# Patient Record
Sex: Female | Born: 1945
Health system: Southern US, Community
[De-identification: ages and names within clinical notes are randomized; demographics above are authoritative.]

## PROBLEM LIST (undated history)

## (undated) DIAGNOSIS — E7800 Pure hypercholesterolemia, unspecified: Secondary | ICD-10-CM

## (undated) DIAGNOSIS — I10 Essential (primary) hypertension: Secondary | ICD-10-CM

## (undated) HISTORY — PX: TOE SURGERY: SHX1073

## (undated) HISTORY — PX: TUBAL LIGATION: SHX77

---

## 1998-02-15 ENCOUNTER — Encounter: Admission: RE | Admit: 1998-02-15 | Discharge: 1998-02-15 | Payer: Self-pay | Admitting: Internal Medicine

## 1998-02-25 ENCOUNTER — Encounter: Admission: RE | Admit: 1998-02-25 | Discharge: 1998-02-25 | Payer: Self-pay | Admitting: Internal Medicine

## 1998-03-21 ENCOUNTER — Encounter: Admission: RE | Admit: 1998-03-21 | Discharge: 1998-03-21 | Payer: Self-pay | Admitting: Hematology and Oncology

## 1998-05-06 ENCOUNTER — Observation Stay (HOSPITAL_COMMUNITY): Admission: EM | Admit: 1998-05-06 | Discharge: 1998-05-06 | Payer: Self-pay | Admitting: Emergency Medicine

## 1998-07-04 ENCOUNTER — Emergency Department (HOSPITAL_COMMUNITY): Admission: EM | Admit: 1998-07-04 | Discharge: 1998-07-04 | Payer: Self-pay | Admitting: Emergency Medicine

## 1998-07-04 ENCOUNTER — Encounter: Payer: Self-pay | Admitting: Emergency Medicine

## 2000-07-12 ENCOUNTER — Other Ambulatory Visit: Admission: RE | Admit: 2000-07-12 | Discharge: 2000-07-12 | Payer: Self-pay | Admitting: *Deleted

## 2000-07-13 ENCOUNTER — Encounter (INDEPENDENT_AMBULATORY_CARE_PROVIDER_SITE_OTHER): Payer: Self-pay | Admitting: Specialist

## 2000-07-13 ENCOUNTER — Other Ambulatory Visit: Admission: RE | Admit: 2000-07-13 | Discharge: 2000-07-13 | Payer: Self-pay | Admitting: *Deleted

## 2001-03-23 ENCOUNTER — Emergency Department (HOSPITAL_COMMUNITY): Admission: EM | Admit: 2001-03-23 | Discharge: 2001-03-23 | Payer: Self-pay

## 2001-05-09 ENCOUNTER — Inpatient Hospital Stay (HOSPITAL_COMMUNITY): Admission: AD | Admit: 2001-05-09 | Discharge: 2001-05-09 | Payer: Self-pay | Admitting: Obstetrics & Gynecology

## 2001-06-10 ENCOUNTER — Emergency Department (HOSPITAL_COMMUNITY): Admission: EM | Admit: 2001-06-10 | Discharge: 2001-06-10 | Payer: Self-pay | Admitting: Emergency Medicine

## 2001-06-21 ENCOUNTER — Encounter: Admission: RE | Admit: 2001-06-21 | Discharge: 2001-06-21 | Payer: Self-pay | Admitting: Obstetrics & Gynecology

## 2001-06-21 ENCOUNTER — Other Ambulatory Visit: Admission: RE | Admit: 2001-06-21 | Discharge: 2001-06-21 | Payer: Self-pay | Admitting: *Deleted

## 2001-07-04 ENCOUNTER — Emergency Department (HOSPITAL_COMMUNITY): Admission: EM | Admit: 2001-07-04 | Discharge: 2001-07-04 | Payer: Self-pay | Admitting: Emergency Medicine

## 2001-07-12 ENCOUNTER — Encounter: Admission: RE | Admit: 2001-07-12 | Discharge: 2001-07-12 | Payer: Self-pay | Admitting: Obstetrics & Gynecology

## 2001-07-14 ENCOUNTER — Ambulatory Visit (HOSPITAL_COMMUNITY): Admission: RE | Admit: 2001-07-14 | Discharge: 2001-07-14 | Payer: Self-pay | Admitting: Internal Medicine

## 2001-07-14 ENCOUNTER — Encounter: Admission: RE | Admit: 2001-07-14 | Discharge: 2001-07-14 | Payer: Self-pay | Admitting: Internal Medicine

## 2001-12-02 ENCOUNTER — Encounter: Admission: RE | Admit: 2001-12-02 | Discharge: 2001-12-02 | Payer: Self-pay | Admitting: Internal Medicine

## 2001-12-02 ENCOUNTER — Ambulatory Visit (HOSPITAL_COMMUNITY): Admission: RE | Admit: 2001-12-02 | Discharge: 2001-12-02 | Payer: Self-pay | Admitting: Internal Medicine

## 2002-06-24 ENCOUNTER — Emergency Department (HOSPITAL_COMMUNITY): Admission: EM | Admit: 2002-06-24 | Discharge: 2002-06-25 | Payer: Self-pay | Admitting: Emergency Medicine

## 2002-10-13 ENCOUNTER — Ambulatory Visit (HOSPITAL_COMMUNITY): Admission: RE | Admit: 2002-10-13 | Discharge: 2002-10-13 | Payer: Self-pay

## 2002-11-13 ENCOUNTER — Ambulatory Visit (HOSPITAL_BASED_OUTPATIENT_CLINIC_OR_DEPARTMENT_OTHER): Admission: RE | Admit: 2002-11-13 | Discharge: 2002-11-13 | Payer: Self-pay

## 2002-11-13 ENCOUNTER — Encounter (INDEPENDENT_AMBULATORY_CARE_PROVIDER_SITE_OTHER): Payer: Self-pay | Admitting: *Deleted

## 2003-08-09 ENCOUNTER — Emergency Department (HOSPITAL_COMMUNITY): Admission: EM | Admit: 2003-08-09 | Discharge: 2003-08-09 | Payer: Self-pay | Admitting: Emergency Medicine

## 2003-10-31 ENCOUNTER — Encounter: Admission: RE | Admit: 2003-10-31 | Discharge: 2003-10-31 | Payer: Self-pay | Admitting: Internal Medicine

## 2004-05-28 ENCOUNTER — Ambulatory Visit: Payer: Self-pay | Admitting: Nurse Practitioner

## 2004-06-12 ENCOUNTER — Ambulatory Visit: Payer: Self-pay | Admitting: *Deleted

## 2004-07-22 ENCOUNTER — Ambulatory Visit: Payer: Self-pay | Admitting: Nurse Practitioner

## 2004-08-29 ENCOUNTER — Ambulatory Visit: Payer: Self-pay | Admitting: Nurse Practitioner

## 2004-09-09 ENCOUNTER — Ambulatory Visit: Payer: Self-pay | Admitting: Nurse Practitioner

## 2004-09-11 ENCOUNTER — Ambulatory Visit: Payer: Self-pay | Admitting: Nurse Practitioner

## 2004-12-16 ENCOUNTER — Ambulatory Visit: Payer: Self-pay | Admitting: Nurse Practitioner

## 2005-07-03 ENCOUNTER — Ambulatory Visit: Payer: Self-pay | Admitting: Family Medicine

## 2005-11-20 ENCOUNTER — Ambulatory Visit: Payer: Self-pay | Admitting: Nurse Practitioner

## 2005-11-26 ENCOUNTER — Encounter: Admission: RE | Admit: 2005-11-26 | Discharge: 2005-11-26 | Payer: Self-pay | Admitting: Internal Medicine

## 2005-12-04 ENCOUNTER — Ambulatory Visit: Payer: Self-pay | Admitting: Nurse Practitioner

## 2005-12-29 ENCOUNTER — Emergency Department (HOSPITAL_COMMUNITY): Admission: EM | Admit: 2005-12-29 | Discharge: 2005-12-29 | Payer: Self-pay | Admitting: Emergency Medicine

## 2006-02-05 ENCOUNTER — Ambulatory Visit: Payer: Self-pay | Admitting: Gynecology

## 2006-04-02 ENCOUNTER — Other Ambulatory Visit: Admission: RE | Admit: 2006-04-02 | Discharge: 2006-04-02 | Payer: Self-pay | Admitting: Obstetrics & Gynecology

## 2006-04-02 ENCOUNTER — Ambulatory Visit: Payer: Self-pay | Admitting: Obstetrics & Gynecology

## 2006-04-02 ENCOUNTER — Encounter (INDEPENDENT_AMBULATORY_CARE_PROVIDER_SITE_OTHER): Payer: Self-pay | Admitting: Specialist

## 2006-04-16 ENCOUNTER — Ambulatory Visit: Payer: Self-pay | Admitting: Gynecology

## 2006-05-27 ENCOUNTER — Ambulatory Visit: Payer: Self-pay | Admitting: Nurse Practitioner

## 2006-06-15 ENCOUNTER — Ambulatory Visit: Payer: Self-pay | Admitting: Nurse Practitioner

## 2006-07-21 ENCOUNTER — Ambulatory Visit: Payer: Self-pay | Admitting: Nurse Practitioner

## 2006-07-27 ENCOUNTER — Ambulatory Visit (HOSPITAL_COMMUNITY): Admission: RE | Admit: 2006-07-27 | Discharge: 2006-07-27 | Payer: Self-pay | Admitting: Family Medicine

## 2008-07-04 ENCOUNTER — Encounter: Admission: RE | Admit: 2008-07-04 | Discharge: 2008-07-04 | Payer: Self-pay | Admitting: Occupational Medicine

## 2010-12-30 ENCOUNTER — Other Ambulatory Visit: Payer: Self-pay | Admitting: Family Medicine

## 2010-12-30 ENCOUNTER — Other Ambulatory Visit (HOSPITAL_COMMUNITY): Payer: Self-pay | Admitting: Family Medicine

## 2010-12-30 DIAGNOSIS — Z1231 Encounter for screening mammogram for malignant neoplasm of breast: Secondary | ICD-10-CM

## 2011-01-07 ENCOUNTER — Ambulatory Visit (HOSPITAL_COMMUNITY)
Admission: RE | Admit: 2011-01-07 | Discharge: 2011-01-07 | Disposition: A | Discharge status: Home / Self Care | Payer: Self-pay | Source: Ambulatory Visit | Attending: Family Medicine | Admitting: Family Medicine

## 2011-01-07 DIAGNOSIS — Z1231 Encounter for screening mammogram for malignant neoplasm of breast: Secondary | ICD-10-CM | POA: Insufficient documentation

## 2011-01-23 NOTE — Op Note (Signed)
[name], Barry                      ACCOUNT NO.:  [id number]   MEDICAL RECORD NO.:  [id number]                   PATIENT TYPE:  AMB   LOCATION:  DSC                                  FACILITY:  [facility]   PHYSICIAN:  Alvan Dame, D.P.M.              DATE OF BIRTH:  1946-04-04   DATE OF PROCEDURE:  11/13/2002  DATE OF DISCHARGE:  11/13/2002                                 OPERATIVE REPORT   PREOPERATIVE DIAGNOSIS:  Suspect foreign body abscess, fourth interspace of  the right foot as well as suspect osteomyelitis fifth metatarsal, midshaft  of the right foot.   POSTOPERATIVE DIAGNOSIS:  Suspect foreign body abscess, fourth interspace of  the right foot as well as suspect osteomyelitis fifth metatarsal, midshaft  of the right foot.  A toothpick greater than 1-1/2 length was identified in  the fourth interspace adjacent to the medial surface of the fourth  metatarsal and extending into the fourth webspace interspace area.  Associated soft tissue abscess and discharge and drainage noted.   OPERATION PERFORMED:  Excision of foreign body abscess, soft tissue mass as  well as partial ray resection/amputation fifth metatarsal and fifth digit of  the right foot.   SURGEON:  Alvan Dame, D.P.M.   ANESTHESIA:  Managed anesthesia care, local anesthetic administered total of  approximately 15 cc 50/50 mixture of 2% Xylocaine plain with 0.5% Marcaine  plain with some additional being provided intraoperatively.   TOURNIQUET:  Hemostasis, right ankle tourniquet at 250 mmHg.   INDICATIONS FOR PROCEDURE:  The patient has had a one year history of soft  tissue mass, swelling, pain, difficulty, previous series of x-rays revealed  no signs of foreign body where a toothpick would not have shown up on plain  x-ray.  The patient has undergone MRI which indicated soft tissue abscess  and osteomyelitic changes of the fifth metatarsal with plain radiograph  showing periosteal reaction  along the medial surface of the fifth metatarsal  on AP and oblique views.  Based on her clinical history, a large soft tissue  mass, probable painful tender mass in the fourth interspace.  Patient has  elected and my recommendation is for surgical intervention at this time.   DESCRIPTION OF PROCEDURE:  The patient was brought to the operating room,  placed on the table in the supine position where IV sedation was  established.  The foot was prepped and draped in the usual aseptic manner.  Local anesthetic was administered at this time.  The foot was exsanguinated  with the Esmarch wrap and ankle tourniquet inflated to 250 mmHg.  The  following procedure was then carried out.  Exploration of fourth  intermetatarsal space, excision of soft tissue mass/abscess and based on  interpretive findings, likely possible amputation of the fifth metatarsal  midshaft ray resection and digital amputation.  At this time attention was  directed to the dorsal aspect of the right foot overlying the  fourth  metatarsal space and fourth webspace.  An approximately 7 to 8 cm linear  incision was made along the dorsal surface in line with the extensor tendons  to the fourth web space.  Incision was deepened through sharp and blunt  dissection with hemostasis being acquired as necessary down to the level of  the capsular structures.  The soft tissue capsular mass was identified  within the fourth webspace.  It was extremely fibrotic and intimately  attached to both the medial capsule of the fourth metatarsal, lateral  capsule of the fourth metatarsal, medial capsule of the fifth metatarsal.  At this time with careful dissection, the soft tissue mass was teased away  from adjacent muscle belly, the digital interossei of the fourth webspace  and at this time during the dissection a foreign body was encountered in the  form of a toothpick.  Approximately two thirds of an entire toothpick was  identified within the  abscess site.  A considerable amount of purulent  discharge drainage was also expressed on debridement of this abscess.  Appropriate aerobic and an aerobic cultures were taken at this time and  immediately afterwards, the patient was administered IV antibiotics by  anesthesia 1g of Ancef.  Additional antibiotics will be administered  postoperatively.  The abscess was excised.  Cultures, aerobic and anaerobic  will be obtained at this time.  At this time the entire remaining abscess  and necrotic tissue from the webspace was removed.  The lateral surface of  the fourth metatarsal appeared to be relatively normal.  All necrotic tissue  was debrided carefully at this time.  The area was lavaged with copious  amounts of sterile antibiotic solution and cleared of all soft tissue  debris.  At this time the medial surface of the fifth metatarsal was noted  to be raised and elevated adjacent to where the toothpick was identified and  the capsule-like mass was intimately involved with the medial surface of the  fifth metatarsal shaft.  At this time it was elected to remove the fifth  metatarsal proximal to its midshaft involvement.  At this time utilizing  _______  instrumentation, first the dissection was deepened to the level of  the base of the fifth metatarsal.  ________ instrumentation was utilized to  make an osteotomy to the proximal one third of the fifth metatarsal.  An  oblique osteotomy was carried out.  All rough edges were smoothed.  At this  time the bone was carefully dissected away.  The incision was formed into a  tennis racquet incision encompassing the fifth digit and the fifth digit  including the enteric capsule, the fifth MTP and distal two thirds of the  fifth metatarsal shaft were excised from the site in toto and submitted for  pathology.  At this time the site was carefully lavaged with copious amounts of sterile antibiotic solution and cleared of all soft tissue debris.   A  flap of lateral plantar tissue was made to reapproximate closure.  This  wound was closed with 4-0 Dexon to reapproximate the subcutaneous tissue and  3-0 nylon for skin reapproximation.  It was closed over a quarter inch  Penrose drain.  At this time a dry sterile dressing was applied and ankle  tourniquet deflated with immediate return of perfusion to all remaining  digits being noted.  The patient was discharged from surgery to recovery in  satisfactory condition with vital signs stable. Discharged with all written  postoperative instructions, prescriptions  for pain and antibiotic medication  and appointment for follow up office visit.  The patient will be ambulatory  in a Darco shoe with minimal activities and will be followed up again within  two days for removal of Penrose drain at the [facility].                                                Alvan Dame, D.P.M.    RS/MEDQ  D:  11/15/2002  T:  11/16/2002  Job:  [id number]

## 2011-03-03 ENCOUNTER — Other Ambulatory Visit: Payer: Self-pay | Admitting: Obstetrics and Gynecology

## 2011-10-16 ENCOUNTER — Other Ambulatory Visit: Payer: Self-pay | Admitting: Family Medicine

## 2011-11-29 ENCOUNTER — Other Ambulatory Visit: Payer: Self-pay | Admitting: Family Medicine

## 2012-02-12 ENCOUNTER — Other Ambulatory Visit (HOSPITAL_COMMUNITY): Payer: Self-pay | Admitting: Internal Medicine

## 2012-02-12 DIAGNOSIS — Z1231 Encounter for screening mammogram for malignant neoplasm of breast: Secondary | ICD-10-CM

## 2012-03-09 ENCOUNTER — Ambulatory Visit (HOSPITAL_COMMUNITY)
Admission: RE | Admit: 2012-03-09 | Discharge: 2012-03-09 | Disposition: A | Discharge status: Home / Self Care | Payer: Self-pay | Source: Ambulatory Visit | Attending: Internal Medicine | Admitting: Internal Medicine

## 2012-03-09 DIAGNOSIS — Z1231 Encounter for screening mammogram for malignant neoplasm of breast: Secondary | ICD-10-CM

## 2012-03-15 ENCOUNTER — Other Ambulatory Visit: Payer: Self-pay | Admitting: Internal Medicine

## 2012-03-15 DIAGNOSIS — R928 Other abnormal and inconclusive findings on diagnostic imaging of breast: Secondary | ICD-10-CM

## 2012-03-22 ENCOUNTER — Ambulatory Visit
Admission: RE | Admit: 2012-03-22 | Discharge: 2012-03-22 | Disposition: A | Discharge status: Home / Self Care | Payer: Self-pay | Source: Ambulatory Visit | Attending: Internal Medicine | Admitting: Internal Medicine

## 2012-03-22 DIAGNOSIS — R928 Other abnormal and inconclusive findings on diagnostic imaging of breast: Secondary | ICD-10-CM

## 2012-07-07 ENCOUNTER — Emergency Department (INDEPENDENT_AMBULATORY_CARE_PROVIDER_SITE_OTHER)
Admission: EM | Admit: 2012-07-07 | Discharge: 2012-07-07 | Disposition: A | Discharge status: Home / Self Care | Payer: No Typology Code available for payment source | Source: Home / Self Care | Attending: Emergency Medicine | Admitting: Emergency Medicine

## 2012-07-07 ENCOUNTER — Encounter (HOSPITAL_COMMUNITY): Payer: Self-pay | Admitting: Emergency Medicine

## 2012-07-07 DIAGNOSIS — I10 Essential (primary) hypertension: Secondary | ICD-10-CM

## 2012-07-07 DIAGNOSIS — E785 Hyperlipidemia, unspecified: Secondary | ICD-10-CM

## 2012-07-07 DIAGNOSIS — E876 Hypokalemia: Secondary | ICD-10-CM

## 2012-07-07 DIAGNOSIS — Z720 Tobacco use: Secondary | ICD-10-CM

## 2012-07-07 DIAGNOSIS — F17200 Nicotine dependence, unspecified, uncomplicated: Secondary | ICD-10-CM

## 2012-07-07 HISTORY — DX: Essential (primary) hypertension: I10

## 2012-07-07 HISTORY — DX: Pure hypercholesterolemia, unspecified: E78.00

## 2012-07-07 LAB — POCT I-STAT, CHEM 8
Chloride: 97 mEq/L (ref 96–112)
HCT: 43 % (ref 36.0–46.0)
Hemoglobin: 14.6 g/dL (ref 12.0–15.0)

## 2012-07-07 MED ORDER — PRAVASTATIN SODIUM 40 MG PO TABS: 40.0000 mg | ORAL_TABLET | Freq: Every day | ORAL | Status: DC

## 2012-07-07 MED ORDER — POTASSIUM CHLORIDE CRYS ER 20 MEQ PO TBCR: 20.0000 meq | EXTENDED_RELEASE_TABLET | Freq: Three times a day (TID) | ORAL | Status: DC

## 2012-07-07 MED ORDER — CHLORTHALIDONE 25 MG PO TABS: 25.0000 mg | ORAL_TABLET | Freq: Every day | ORAL | Status: DC

## 2012-07-07 MED ORDER — METOPROLOL TARTRATE 100 MG PO TABS: 100.0000 mg | ORAL_TABLET | Freq: Two times a day (BID) | ORAL | Status: DC

## 2012-07-07 NOTE — ED Provider Notes (Signed)
Chief Complaint  Patient presents with  . Medication Refill    needs refill on meds took last pills today.    History of Present Illness:  Alyssa Barry is a 67 year old female who has previously been a patient at [facility]. Since it closed down she cannot get any of her refills, and comes in today to get medication refilled.  She has had high blood pressure for years and has been on metoprolol and chlorthalidone. She denies any medication side effects. She does not check her blood pressure at home or at the drugstore. She has occasional headaches and some dizziness. She's had slight cough and shortness of breath. She denies any blurry vision, chest pain, tightness, pressure, palpitations, syncope, ankle edema, or strokelike symptoms.  She also takes pravastatin for hypercholesterolemia and denies any medication side effects such as muscle pain or cramping.  She is a cigarette smoker and is trying to taper down. She is down to about a half pack a day and like to eventually quit.  Review of Systems:  Other than noted above, the patient denies any of the following symptoms: Systemic:  No fever, chills, fatigue, weight loss or gain. Respiratory:  No coughing, wheezing, or shortness of breath. Cardiac:  No chest pain, tightness, pressure, palpitations, syncope, or edema. Neuro:  No headache, dizziness, blurred vision, weakness, paresthesias, or strokelike symptoms.  PMFSH:  Past medical history, family history, social history, meds, and allergies were reviewed.  Physical Exam:   Vital signs:  BP 158/93  Pulse 74  Temp 97.6 F (36.4 C) (Oral)  Resp 20  SpO2 98% General:  Alert, oriented, in no distress. Lungs:  Breath sounds clear and equal bilaterally.  No wheezes, rales, or rhonchi. Heart:  Regular rhythm, no gallops, murmers, clicks or rubs.  Abdomen:  Soft and flat.  Nontender, no organomegaly or mass.  No pulsatile midline abdominal mass or bruit. Ext:  No edema, pulses  full.  Labs:   Results for orders placed during the hospital encounter of 07/07/12  POCT I-STAT, CHEM 8      Component Value Range   Sodium 142  135 - 145 mEq/L   Potassium 2.9 (*) 3.5 - 5.1 mEq/L   Chloride 97  96 - 112 mEq/L   BUN 11  6 - 23 mg/dL   Creatinine, Ser 4.09  0.50 - 1.10 mg/dL   Glucose, Bld 94  70 - 99 mg/dL   Calcium, Ion 8.11  9.14 - 1.30 mmol/L   TCO2 30  0 - 100 mmol/L   Hemoglobin 14.6  12.0 - 15.0 g/dL   HCT 78.2  95.6 - 21.3 %    Assessment:  The primary encounter diagnosis was Hypertension. Diagnoses of Hyperlipidemia, Tobacco use, and Hypokalemia were also pertinent to this visit.  Plan:   1.  The following meds were prescribed:   New Prescriptions   CHLORTHALIDONE (HYGROTON) 25 MG TABLET    Take 1 tablet (25 mg total) by mouth daily.   METOPROLOL (LOPRESSOR) 100 MG TABLET    Take 1 tablet (100 mg total) by mouth 2 (two) times daily.   POTASSIUM CHLORIDE SA (KLOR-CON M20) 20 MEQ TABLET    Take 1 tablet (20 mEq total) by mouth 3 (three) times daily.   PRAVASTATIN (PRAVACHOL) 40 MG TABLET    Take 1 tablet (40 mg total) by mouth daily.   2.  The patient was instructed in symptomatic care and handouts were given. She was strongly encouraged to quit smoking  and given some handouts about this. I suggested she return again in a week to recheck her potassium since it was so low. I suggested she seek a primary care physician for ongoing care of her blood pressure. 3.  The patient was told to return if becoming worse in any way, if no better in 3 or 4 days, and given some red flag symptoms that would indicate earlier return.    Reuben Likes, MD 07/07/12 1140

## 2012-07-07 NOTE — ED Notes (Signed)
[facility]   Pt took last of medications this a.m.   Needs refill on meds. Until she can find a new doc.

## 2012-11-17 ENCOUNTER — Encounter (HOSPITAL_COMMUNITY): Payer: Self-pay

## 2012-11-17 ENCOUNTER — Emergency Department (INDEPENDENT_AMBULATORY_CARE_PROVIDER_SITE_OTHER)
Admission: EM | Admit: 2012-11-17 | Discharge: 2012-11-17 | Disposition: A | Discharge status: Home / Self Care | Payer: Self-pay | Source: Home / Self Care | Attending: Emergency Medicine | Admitting: Emergency Medicine

## 2012-11-17 DIAGNOSIS — I10 Essential (primary) hypertension: Secondary | ICD-10-CM

## 2012-11-17 LAB — POCT I-STAT, CHEM 8
BUN: 14 mg/dL (ref 6–23)
Calcium, Ion: 1.23 mmol/L (ref 1.13–1.30)
Hemoglobin: 14.3 g/dL (ref 12.0–15.0)
Sodium: 142 mEq/L (ref 135–145)

## 2012-11-17 MED ORDER — TRIAMTERENE-HCTZ 37.5-25 MG PO TABS: 1.0000 | ORAL_TABLET | Freq: Every day | ORAL | Status: DC

## 2012-11-17 MED ORDER — METOPROLOL TARTRATE 100 MG PO TABS: 100.0000 mg | ORAL_TABLET | Freq: Two times a day (BID) | ORAL | Status: DC

## 2012-11-17 MED ORDER — PRAVASTATIN SODIUM 40 MG PO TABS: 40.0000 mg | ORAL_TABLET | Freq: Every day | ORAL | Status: DC

## 2012-11-17 NOTE — ED Notes (Signed)
Requesting new Rx for BP, as she is out

## 2012-11-17 NOTE — ED Provider Notes (Signed)
Chief Complaint:   Chief Complaint  Patient presents with  . Hypertension    History of Present Illness:   Alyssa Barry is is a 67 year old female who returns for refill on blood pressure and cholesterol medication. She was here about 3 months ago for the same thing. She has not yet found a primary care physician, although she will be getting Medicaid and plans to go to Dr. Jackquline Denmark. She remains on chlorthalidone 25 mg a day, metoprolol tolerate 100 mg twice a day, pravastatin 40 mg a day, and she also has some potassium pills which he takes erratically. When she was here last October i-STAT 8 was done showing a potassium of 2.9. She's been taking up to 3 potassium pills per day and trying to get more fruits and fruit juices. She denies any headaches, dizziness, lightheadedness, blurry vision, shortness of breath, chest pain, tightness, pressure, palpitations, syncope, or ankle edema.  Review of Systems:  Other than noted above, the patient denies any of the following symptoms: Systemic:  No fever, chills, fatigue, weight loss or gain. Respiratory:  No coughing, wheezing, or shortness of breath. Cardiac:  No chest pain, tightness, pressure, palpitations, syncope, or edema. Neuro:  No headache, dizziness, blurred vision, weakness, paresthesias, or strokelike symptoms.  PMFSH:  Past medical history, family history, social history, meds, and allergies were reviewed.  Physical Exam:   Vital signs:  BP 142/75  Pulse 90  Temp(Src) 97.9 F (36.6 C) (Oral)  Resp 16  SpO2 100% General:  Alert, oriented, in no distress. Lungs:  Breath sounds clear and equal bilaterally.  No wheezes, rales, or rhonchi. Heart:  Regular rhythm, no gallops, murmers, clicks or rubs.  Abdomen:  Soft and flat.  Nontender, no organomegaly or mass.  No pulsatile midline abdominal mass or bruit. Ext:  No edema, pulses full.  Labs:   Results for orders placed during the hospital encounter of 11/17/12  POCT I-STAT,  CHEM 8      Result Value Range   Sodium 142  135 - 145 mEq/L   Potassium 3.0 (*) 3.5 - 5.1 mEq/L   Chloride 99  96 - 112 mEq/L   BUN 14  6 - 23 mg/dL   Creatinine, Ser 1.91  0.50 - 1.10 mg/dL   Glucose, Bld 92  70 - 99 mg/dL   Calcium, Ion 4.78  2.95 - 1.30 mmol/L   TCO2 34  0 - 100 mmol/L   Hemoglobin 14.3  12.0 - 15.0 g/dL   HCT 62.1  30.8 - 65.7 %    Assessment:  The primary encounter diagnosis was Hypertension. Diagnoses of Hypokalemia and Hypercholesteremia were also pertinent to this visit.  Her blood pressure is fairly well controlled, her potassium is still low, although a little bit better than it was at her last visit here. I'm going to stop her chlorthalidone since this is.was contributing to her hypokalemia and switched to triamterene/HCTZ instead. I suggested she take one potassium twice daily for the next week. I will like her to get this checked in one to 2 weeks either here or by Dr. Parke Simmers.  Plan:   1.  The following meds were prescribed:   Discharge Medication List as of 11/17/2012  4:41 PM    START taking these medications   Details  !! metoprolol (LOPRESSOR) 100 MG tablet Take 1 tablet (100 mg total) by mouth 2 (two) times daily., Starting 11/17/2012, Until Discontinued, Normal    !! pravastatin (PRAVACHOL) 40 MG tablet Take 1  tablet (40 mg total) by mouth daily., Starting 11/17/2012, Until Discontinued, Normal    triamterene-hydrochlorothiazide (MAXZIDE-25) 37.5-25 MG per tablet Take 1 each (1 tablet total) by mouth daily., Starting 11/17/2012, Until Discontinued, Normal     !! - Potential duplicate medications found. Please discuss with Lizbett Garciagarcia.     2.  The patient was instructed in symptomatic care and handouts were given. 3.  The patient was told to return if becoming worse in any way, if no better in 3 or 4 days, and given some red flag symptoms that would indicate earlier return.    Reuben Likes, MD 11/17/12 1710

## 2013-05-09 ENCOUNTER — Other Ambulatory Visit: Payer: Self-pay

## 2013-05-09 DIAGNOSIS — Z1231 Encounter for screening mammogram for malignant neoplasm of breast: Secondary | ICD-10-CM

## 2013-05-11 ENCOUNTER — Encounter (HOSPITAL_COMMUNITY): Payer: Self-pay | Admitting: Emergency Medicine

## 2013-05-11 ENCOUNTER — Emergency Department (INDEPENDENT_AMBULATORY_CARE_PROVIDER_SITE_OTHER)
Admission: EM | Admit: 2013-05-11 | Discharge: 2013-05-11 | Disposition: A | Discharge status: Home / Self Care | Payer: Medicare Other | Source: Home / Self Care

## 2013-05-11 DIAGNOSIS — E669 Obesity, unspecified: Secondary | ICD-10-CM | POA: Diagnosis not present

## 2013-05-11 DIAGNOSIS — I10 Essential (primary) hypertension: Secondary | ICD-10-CM | POA: Diagnosis not present

## 2013-05-11 MED ORDER — TRIAMTERENE-HCTZ 37.5-25 MG PO CAPS: 1.0000 | ORAL_CAPSULE | ORAL | Status: DC

## 2013-05-11 MED ORDER — METOPROLOL TARTRATE 100 MG PO TABS: 100.0000 mg | ORAL_TABLET | Freq: Two times a day (BID) | ORAL | Status: DC

## 2013-05-11 NOTE — ED Provider Notes (Signed)
CSN: [id number]     Arrival date & time 05/11/13  0820 History   None    Chief Complaint  Patient presents with  . Medication Refill   (Consider location/radiation/quality/duration/timing/severity/associated sxs/prior Treatment) HPI Comments: 67 year old morbidly obese female who presents to the urgent care to have her blood pressure medications refilled. She has been to this urgent care at least twice before for chronic medication refills. She states she has an appointment with Dr. Parke Simmers on the 26th of this month. Requesting prescriptions of metoprolol tartrate and Dyazide. She has no complaints today review of systems negative with the exception of chronic lower extremity discomfort.   Past Medical History  Diagnosis Date  . Hypertension   . High cholesterol    Past Surgical History  Procedure Laterality Date  . Toe surgery    . Tubal ligation Bilateral    Family History  Problem Relation Age of Onset  . Hypertension Other    History  Substance Use Topics  . Smoking status: Current Every Day Smoker -- 1.00 packs/day    Types: Cigarettes  . Smokeless tobacco: Not on file  . Alcohol Use: Yes     Comment: occasional   OB History   Grav Para Term Preterm Abortions TAB SAB Ect Mult Living                 Review of Systems  Constitutional: Negative.   Respiratory: Negative.   Cardiovascular: Negative.   Genitourinary: Negative.   Musculoskeletal:       Bilateral Lower extremity discomfort.  Skin: Negative for rash.  Neurological: Negative.     Allergies  Aspirin  Home Medications   Current Outpatient Rx  Name  Route  Sig  Dispense  Refill  . metoprolol (LOPRESSOR) 100 MG tablet   Oral   Take 1 tablet (100 mg total) by mouth 2 (two) times daily.   60 tablet   5   . pravastatin (PRAVACHOL) 40 MG tablet   Oral   Take 1 tablet (40 mg total) by mouth daily.   30 tablet   5   . triamterene-hydrochlorothiazide (MAXZIDE-25) 37.5-25 MG per tablet   Oral   Take  1 each (1 tablet total) by mouth daily.   30 tablet   5   . metoprolol (LOPRESSOR) 100 MG tablet   Oral   Take 1 tablet (100 mg total) by mouth 2 (two) times daily.   30 tablet   3   . metoprolol (LOPRESSOR) 100 MG tablet   Oral   Take 1 tablet (100 mg total) by mouth 2 (two) times daily.   60 tablet   0   . metoprolol succinate (TOPROL-XL) 100 MG 24 hr tablet   Oral   Take 100 mg by mouth daily. Take with or immediately following a meal.         . potassium chloride SA (KLOR-CON M20) 20 MEQ tablet   Oral   Take 1 tablet (20 mEq total) by mouth 3 (three) times daily.   90 tablet   3   . pravastatin (PRAVACHOL) 40 MG tablet   Oral   Take 1 tablet (40 mg total) by mouth daily.   30 tablet   3   . PRAVASTATIN SODIUM PO   Oral   Take by mouth.         . triamterene-hydrochlorothiazide (DYAZIDE) 37.5-25 MG per capsule   Oral   Take 1 each (1 capsule total) by mouth every morning.   30  capsule   0    BP 148/92  Pulse 90  Temp(Src) 98.2 F (36.8 C) (Oral)  Resp 18  SpO2 97% Physical Exam  Nursing note and vitals reviewed. Constitutional: She is oriented to person, place, and time. She appears well-developed and well-nourished. No distress.  Neck: Normal range of motion. Neck supple.  Cardiovascular: Normal rate, regular rhythm and normal heart sounds.   Pulmonary/Chest: Effort normal and breath sounds normal. No respiratory distress.  Musculoskeletal: She exhibits no edema and no tenderness.  Neurological: She is alert and oriented to person, place, and time.  Skin: Skin is warm and dry. No rash noted.  Psychiatric: She has a normal mood and affect.    ED Course  Procedures (including critical care time) Labs Review Labs Reviewed - No data to display Imaging Review No results found.  MDM   1. HTN (hypertension)   2. Obesity    Dyazide 37.5/25 mg by mouth daily Metoprolol tartrate 100 mg twice a day. Both R. axis for one month supply. Must  followup with your PCP on the 26th and will need lab work.    Hayden Rasmussen, NP 05/11/13 [phone]

## 2013-05-11 NOTE — ED Provider Notes (Signed)
Medical screening examination/treatment/procedure(s) were performed by a resident physician or non-physician practitioner and as the supervising physician I was immediately available for consultation/collaboration.  Emmett Arntz, MD   Tauno Falotico S Nylani Michetti, MD 05/11/13 1024 

## 2013-05-11 NOTE — ED Notes (Signed)
Pt is here needing refill on her BP meds States she has appt w/PCP on 28th but will be running out before then Voices no concerns... Alert w/no signs of acute distress.

## 2013-06-02 ENCOUNTER — Ambulatory Visit
Admission: RE | Admit: 2013-06-02 | Discharge: 2013-06-02 | Disposition: A | Discharge status: Home / Self Care | Payer: Medicare Other | Source: Ambulatory Visit

## 2013-06-02 DIAGNOSIS — Z1231 Encounter for screening mammogram for malignant neoplasm of breast: Secondary | ICD-10-CM

## 2013-06-06 DIAGNOSIS — I10 Essential (primary) hypertension: Secondary | ICD-10-CM | POA: Diagnosis not present

## 2013-06-15 DIAGNOSIS — J069 Acute upper respiratory infection, unspecified: Secondary | ICD-10-CM | POA: Diagnosis not present

## 2013-10-07 ENCOUNTER — Emergency Department (HOSPITAL_COMMUNITY)
Admission: EM | Admit: 2013-10-07 | Discharge: 2013-10-07 | Disposition: A | Discharge status: Home / Self Care | Payer: Medicare Other | Attending: Emergency Medicine | Admitting: Emergency Medicine

## 2013-10-07 ENCOUNTER — Encounter (HOSPITAL_COMMUNITY): Payer: Self-pay | Admitting: Emergency Medicine

## 2013-10-07 ENCOUNTER — Emergency Department (HOSPITAL_COMMUNITY): Payer: Medicare Other

## 2013-10-07 DIAGNOSIS — S92009A Unspecified fracture of unspecified calcaneus, initial encounter for closed fracture: Secondary | ICD-10-CM | POA: Diagnosis not present

## 2013-10-07 DIAGNOSIS — S92001A Unspecified fracture of right calcaneus, initial encounter for closed fracture: Secondary | ICD-10-CM

## 2013-10-07 DIAGNOSIS — W19XXXA Unspecified fall, initial encounter: Secondary | ICD-10-CM

## 2013-10-07 DIAGNOSIS — S92109A Unspecified fracture of unspecified talus, initial encounter for closed fracture: Secondary | ICD-10-CM | POA: Insufficient documentation

## 2013-10-07 DIAGNOSIS — I10 Essential (primary) hypertension: Secondary | ICD-10-CM | POA: Diagnosis not present

## 2013-10-07 DIAGNOSIS — S82831A Other fracture of upper and lower end of right fibula, initial encounter for closed fracture: Secondary | ICD-10-CM

## 2013-10-07 DIAGNOSIS — S2239XA Fracture of one rib, unspecified side, initial encounter for closed fracture: Secondary | ICD-10-CM | POA: Insufficient documentation

## 2013-10-07 DIAGNOSIS — Z8639 Personal history of other endocrine, nutritional and metabolic disease: Secondary | ICD-10-CM | POA: Insufficient documentation

## 2013-10-07 DIAGNOSIS — Z862 Personal history of diseases of the blood and blood-forming organs and certain disorders involving the immune mechanism: Secondary | ICD-10-CM | POA: Insufficient documentation

## 2013-10-07 DIAGNOSIS — S92101A Unspecified fracture of right talus, initial encounter for closed fracture: Secondary | ICD-10-CM

## 2013-10-07 DIAGNOSIS — S99919A Unspecified injury of unspecified ankle, initial encounter: Secondary | ICD-10-CM | POA: Diagnosis not present

## 2013-10-07 DIAGNOSIS — Z79899 Other long term (current) drug therapy: Secondary | ICD-10-CM | POA: Insufficient documentation

## 2013-10-07 DIAGNOSIS — S8263XA Displaced fracture of lateral malleolus of unspecified fibula, initial encounter for closed fracture: Secondary | ICD-10-CM | POA: Diagnosis not present

## 2013-10-07 DIAGNOSIS — F17200 Nicotine dependence, unspecified, uncomplicated: Secondary | ICD-10-CM | POA: Diagnosis not present

## 2013-10-07 DIAGNOSIS — S8990XA Unspecified injury of unspecified lower leg, initial encounter: Secondary | ICD-10-CM | POA: Diagnosis not present

## 2013-10-07 DIAGNOSIS — S82899A Other fracture of unspecified lower leg, initial encounter for closed fracture: Secondary | ICD-10-CM | POA: Insufficient documentation

## 2013-10-07 DIAGNOSIS — W010XXA Fall on same level from slipping, tripping and stumbling without subsequent striking against object, initial encounter: Secondary | ICD-10-CM | POA: Insufficient documentation

## 2013-10-07 DIAGNOSIS — Y929 Unspecified place or not applicable: Secondary | ICD-10-CM | POA: Insufficient documentation

## 2013-10-07 DIAGNOSIS — M79609 Pain in unspecified limb: Secondary | ICD-10-CM | POA: Diagnosis not present

## 2013-10-07 DIAGNOSIS — S2232XA Fracture of one rib, left side, initial encounter for closed fracture: Secondary | ICD-10-CM

## 2013-10-07 DIAGNOSIS — Y9389 Activity, other specified: Secondary | ICD-10-CM | POA: Insufficient documentation

## 2013-10-07 MED ORDER — OXYCODONE-ACETAMINOPHEN 5-325 MG PO TABS: 1.0000 | ORAL_TABLET | ORAL | Status: DC | PRN

## 2013-10-07 MED ORDER — OXYCODONE-ACETAMINOPHEN 5-325 MG PO TABS
1.0000 | ORAL_TABLET | Freq: Once | ORAL | Status: AC
Start: 2013-10-07 — End: 2013-10-07
  Administered 2013-10-07: 1 via ORAL
  Filled 2013-10-07: qty 1

## 2013-10-07 MED ORDER — IBUPROFEN 800 MG PO TABS: 800.0000 mg | ORAL_TABLET | Freq: Three times a day (TID) | ORAL | Status: DC

## 2013-10-07 NOTE — ED Notes (Signed)
Pt reports slip and fall in bathroom on wet floor. Pt denies head injury. Pt c/o left sided rib pain and right foot pain.

## 2013-10-07 NOTE — ED Provider Notes (Signed)
CSN: [id number]     Arrival date & time 10/07/13  1324 History   This chart was scribed for Coral Ceo, PA working with Suzi Roots, MD, by [facility] Royal ED Scribe. This patient was seen in room TR07C/TR07C and the patient's care was started at 10:00 AM.     Chief Complaint  Patient presents with  . Fall  . Breast Pain  . Foot Pain    The history is provided by the patient. No language interpreter was used.   HPI Comments: Alyssa Barry is a 68 y.o. female with a PMH of HTN and HLD who presents to the Emergency Department complaining of constant sharp pain on her left ribs and right foot and ankle from a fall yesterday evening around 4PM. Patient states she slipped on some water in the bathroom. Pt denies head impact or LOC. Her pain is located in her left ribs, top of right foot and right anterior ankle. She has been able to ambulate since her injury with increased pain. Pt took one of her son's pain medication with significant relief (dose/type?). Pt denies headaches, vision changes, neck pain, back pain, chest pain, SOB, abdominal pain, cough/hemoptysis, dysuria, urinary or bowel incontinence, numbness and tingling. Pt states that her son lives with her.   Past Medical History  Diagnosis Date  . Hypertension   . High cholesterol    Past Surgical History  Procedure Laterality Date  . Toe surgery    . Tubal ligation Bilateral    Family History  Problem Relation Age of Onset  . Hypertension Other    History  Substance Use Topics  . Smoking status: Current Every Day Smoker -- 1.00 packs/day    Types: Cigarettes  . Smokeless tobacco: Not on file  . Alcohol Use: Yes     Comment: occasional   OB History   Grav Para Term Preterm Abortions TAB SAB Ect Mult Living                 Review of Systems  HENT: Negative for sore throat.   Eyes: Negative for visual disturbance.  Respiratory: Negative for cough and shortness of breath.   Cardiovascular: Negative for chest  pain.  Gastrointestinal: Negative for vomiting and anal bleeding.  Genitourinary: Negative for dysuria.  Musculoskeletal: Negative for back pain.       Left rib pain. Right leg pain.  Neurological: Negative for numbness and headaches.  All other systems reviewed and are negative.   Allergies  Aspirin  Home Medications   Current Outpatient Rx  Name  Route  Sig  Dispense  Refill  . metoprolol (LOPRESSOR) 100 MG tablet   Oral   Take 100 mg by mouth daily.         Marland Kitchen triamterene-hydrochlorothiazide (DYAZIDE) 37.5-25 MG per capsule   Oral   Take 1 each (1 capsule total) by mouth every morning.   30 capsule   0    Triage Vitals: BP 190/84  Pulse 71  Temp(Src) 98.5 F (36.9 C) (Oral)  Resp 22  Ht 5\' 3"  (1.6 m)  Wt 216 lb (97.977 kg)  BMI 38.27 kg/m2  SpO2 98%  Filed Vitals:   10/07/13 0914 10/07/13 1102 10/07/13 1223  BP: 190/84 172/90 146/90  Pulse: 71 64   Temp: 98.5 F (36.9 C) 97.6 F (36.4 C)   TempSrc: Oral Oral   Resp: 22 17   Height: 5\' 3"  (1.6 m)    Weight: 216 lb (97.977 kg)  SpO2: 98% 100%     Physical Exam  Nursing note and vitals reviewed. Constitutional: She is oriented to person, place, and time. She appears well-developed and well-nourished. No distress.  HENT:  Head: Normocephalic and atraumatic.  Right Ear: External ear normal.  Left Ear: External ear normal.  Nose: Nose normal.  Mouth/Throat: Oropharynx is clear and moist. No oropharyngeal exudate.  No tenderness to the scalp or face throughout. No palpable hematoma, step-offs, or lacerations throughout.  Tympanic membranes gray and translucent bilaterally.    Eyes: Conjunctivae and EOM are normal. Pupils are equal, round, and reactive to light. Right eye exhibits no discharge. Left eye exhibits no discharge.  Neck: Normal range of motion. Neck supple. No tracheal deviation present.  No cervical spinal or paraspinal tenderness to palpation throughout.  No limitations with neck ROM.     Cardiovascular: Normal rate, regular rhythm, normal heart sounds and intact distal pulses.  Exam reveals no gallop and no friction rub.   No murmur heard. No cervical spinal or paraspinal tenderness to palpation throughout.  No limitations with neck ROM.    Pulmonary/Chest: Effort normal and breath sounds normal. No respiratory distress. She has no wheezes. She has no rales. She exhibits tenderness.    Focal tenderness to palpation to the left lower ribs with no palpable step-offs or crepitus. No overlying ecchymosis, erythema, edema, or lacerations.   Abdominal: Soft. Bowel sounds are normal. She exhibits no distension and no mass. There is no tenderness. There is no rebound and no guarding.  Musculoskeletal: Normal range of motion. She exhibits edema and tenderness.       Feet:  Tenderness and mild edema to palpation over the dorsum of the right foot and anterior right ankle diffusely. No calf tenderness or edema bilaterally. No tenderness to the knees or hips bilaterally. ROM in the LE intact bilaterally. Patient able to ambulate without difficulty or ataxia. No tenderness to palpation to the thoracic or lumbar spinous processes throughout.  No tenderness to palpation to the paraspinal muscles throughout.    Neurological: She is alert and oriented to person, place, and time.  Gross sensation intact  Skin: Skin is warm and dry. She is not diaphoretic.  No lacerations, ecchymosis, or erythema to the LE bilaterally  Psychiatric: She has a normal mood and affect. Her behavior is normal.    ED Course  Procedures (including critical care time)  DIAGNOSTIC STUDIES: Oxygen Saturation is 98% on RA, normal by my interpretation.    COORDINATION OF CARE: 10:11 AM- Will order xray of right foot and left ribs. Pt advised of plan for treatment and pt agrees.   Labs Review Labs Reviewed - No data to display Imaging Review Dg Ribs Unilateral W/chest Left  10/07/2013   CLINICAL DATA:  Left  lateral chest pain, fell yesterday  EXAM: LEFT RIBS AND CHEST - 3+ VIEW  COMPARISON:  Chest radiograph 12/30/2005  FINDINGS: Upper normal heart size.  Tortuous aorta.  Mediastinal contours and pulmonary vascularity normal.  Lungs clear.  No pleural effusion or pneumothorax.  Bones appear slightly demineralized.  BB placed at site of symptoms at lower lateral left chest.  Fracture identified at lateral left seventh rib.  IMPRESSION: Fractured lateral left seventh rib.   Electronically Signed   By: Ulyses Southward M.D.   On: 10/07/2013 11:04   Dg Ankle Complete Right  10/07/2013   CLINICAL DATA:  Larey Seat yesterday. Medial ankle pain. History of a fracture 20 years ago.  EXAM: RIGHT ANKLE -  COMPLETE 3+ VIEW  COMPARISON:  None.  FINDINGS: Small sliver of bone lies between the tip of the distal fibula and the adjacent talus, which could reflect an acute avulsion fracture or, more likely, be chronic.  There is a small sliver of bone along the anterior dorsal margin of the talus which could also reflect an acute capsular avulsion fracture or be chronic.  No other evidence of a fracture. The ankle mortise is normally space and aligned. There is diffuse soft tissue swelling. Small plantar and dorsal calcaneal spurs are noted.  IMPRESSION: 1. Evidence of small avulsion fractures adjacent to the tip of the distal fibula and along the anterior dorsal talus either which could be recent, but both are most likely chronic. 2. No other evidence of a fracture. 3. Diffuse soft tissue edema.   Electronically Signed   By: Amie Portland M.D.   On: 10/07/2013 11:01   Dg Foot Complete Right  10/07/2013   CLINICAL DATA:  Fall yesterday. History of a fifth toe amputation. There is medial foot pain.  EXAM: RIGHT FOOT COMPLETE - 3+ VIEW  COMPARISON:  None.  FINDINGS: There has been amputation from the proximal shaft of the fifth metatarsal through the fifth toe.  Questionable small avulsion fracture from the lateral distal calcaneus. There is  no focal associated soft tissue swelling. This is most likely chronic particularly given the medial sided pain.  No other evidence of a fracture. There is diffuse soft tissue swelling. The bones are demineralized. The joints are normally aligned.  IMPRESSION: 1. No convincing acute fracture. Small questionable avulsion fracture along the distal lateral calcaneus is most likely chronic. 2. Status post amputation from the proximal fifth metatarsal through the fifth toe. 3. No dislocation. 4. Diffuse nonspecific soft tissue edema.   Electronically Signed   By: Amie Portland M.D.   On: 10/07/2013 11:03    EKG Interpretation   None      DG Foot Complete Right (Final result)  Result time: 10/07/13 11:03:36    Final result by Rad Results In Interface (10/07/13 11:03:36)    Narrative:   CLINICAL DATA: Fall yesterday. History of a fifth toe amputation. There is medial foot pain.  EXAM: RIGHT FOOT COMPLETE - 3+ VIEW  COMPARISON: None.  FINDINGS: There has been amputation from the proximal shaft of the fifth metatarsal through the fifth toe.  Questionable small avulsion fracture from the lateral distal calcaneus. There is no focal associated soft tissue swelling. This is most likely chronic particularly given the medial sided pain.  No other evidence of a fracture. There is diffuse soft tissue swelling. The bones are demineralized. The joints are normally aligned.  IMPRESSION: 1. No convincing acute fracture. Small questionable avulsion fracture along the distal lateral calcaneus is most likely chronic. 2. Status post amputation from the proximal fifth metatarsal through the fifth toe. 3. No dislocation. 4. Diffuse nonspecific soft tissue edema.   Electronically Signed By: Amie Portland M.D. On: 10/07/2013 11:03             DG Ribs Unilateral W/Chest Left (Final result)  Result time: 10/07/13 11:04:13    Final result by Rad Results In Interface (10/07/13 11:04:13)     Narrative:   CLINICAL DATA: Left lateral chest pain, fell yesterday  EXAM: LEFT RIBS AND CHEST - 3+ VIEW  COMPARISON: Chest radiograph 12/30/2005  FINDINGS: Upper normal heart size.  Tortuous aorta.  Mediastinal contours and pulmonary vascularity normal.  Lungs clear.  No pleural effusion or  pneumothorax.  Bones appear slightly demineralized.  BB placed at site of symptoms at lower lateral left chest.  Fracture identified at lateral left seventh rib.  IMPRESSION: Fractured lateral left seventh rib.   Electronically Signed By: Ulyses SouthwardMark Boles M.D. On: 10/07/2013 11:04             DG Ankle Complete Right (Final result)  Result time: 10/07/13 11:01:18    Final result by Rad Results In Interface (10/07/13 11:01:18)    Narrative:   CLINICAL DATA: Larey SeatFell yesterday. Medial ankle pain. History of a fracture 20 years ago.  EXAM: RIGHT ANKLE - COMPLETE 3+ VIEW  COMPARISON: None.  FINDINGS: Small sliver of bone lies between the tip of the distal fibula and the adjacent talus, which could reflect an acute avulsion fracture or, more likely, be chronic.  There is a small sliver of bone along the anterior dorsal margin of the talus which could also reflect an acute capsular avulsion fracture or be chronic.  No other evidence of a fracture. The ankle mortise is normally space and aligned. There is diffuse soft tissue swelling. Small plantar and dorsal calcaneal spurs are noted.  IMPRESSION: 1. Evidence of small avulsion fractures adjacent to the tip of the distal fibula and along the anterior dorsal talus either which could be recent, but both are most likely chronic. 2. No other evidence of a fracture. 3. Diffuse soft tissue edema.   Electronically Signed By: Amie Portlandavid Ormond M.D. On: 10/07/2013 11:01    MDM   [name] is a 68 y.o. female  is a 68 y.o. female with a PMH of HTN and HLD who presents to the Emergency Department complaining of sharp pain on  her left ribs and right foot and ankle from a fall yesterday evening around 4PM. Patient found to have a fx of the left 7th rib. Vital signs stable. Patient given incentive spirometer and encouraged to continue deep breathing. Patient also found to have a possible fx of the fibula, talus and calcaneous on the right (acute vs chronic). Patient placed in posterior leg splint with stirrup splint. Patient given referral to orthopedics. Patient neurovascularly intact. Patient given short course of medication for OP management.  Patient obtaining a ride home. Return precautions, discharge instructions, and follow-up was discussed with the patient before discharge.     Discharge Medication List as of 10/07/2013 11:55 AM    START taking these medications   Details  ibuprofen (ADVIL,MOTRIN) 800 MG tablet Take 1 tablet (800 mg total) by mouth 3 (three) times daily., Starting 10/07/2013, Until Discontinued, Print    oxyCODONE-acetaminophen (PERCOCET/ROXICET) 5-325 MG per tablet Take 1 tablet by mouth every 4 (four) hours as needed for severe pain., Starting 10/07/2013, Until Discontinued, Print        Final impressions: 1. Fall   2. Fracture of rib of left side   3. Fracture of fibula, distal, right, closed   4. Fracture of talus of right ankle, closed   5. Fracture of calcaneus, right, closed     Luiz IronJessica Katlin Jadd Gasior PA-C   This patient was discussed with Dr. Darrin LuisStenil who evaluated the patient          Jillyn LedgerJessica K Shonique Pelphrey, PA-C 10/09/13 [id number]

## 2013-10-07 NOTE — Discharge Instructions (Signed)
Your x-rays show you have a rib fracture on the left - take deep breaths and use incentive spirometry Take Percocet for severe pain - Please be careful with this medication.  It can cause drowsiness.  Use caution while driving, operating machinery, drinking alcohol, or any other activities that may impair your physical or mental abilities.   Take Ibuprofen for mild-moderate pain - take with food  Your x-rays show you may have a fracture to the right fibula (ankle), right talus & lateral calcaneus (foot) - follow-up with orthopedics regarding these  Return to the emergency department if you develop any changing/worsening condition, fever, coughing up blood, difficulty breathing, increasing swelling/pain, or any other concerns (please read additional information regarding your condition below)      Rib Fracture A rib fracture is a break or crack in one of the bones of the ribs. The ribs are a group of long, curved bones that wrap around your chest and attach to your spine. They protect your lungs and other organs in the chest cavity. A broken or cracked rib is often painful, but most do not cause other problems. Most rib fractures heal on their own over time. However, rib fractures can be more serious if multiple ribs are broken or if broken ribs move out of place and push against other structures. CAUSES   A direct blow to the chest. For example, this could happen during contact sports, a car accident, or a fall against a hard object.  Repetitive movements with high force, such as pitching a baseball or having severe coughing spells. SYMPTOMS   Pain when you breathe in or cough.  Pain when someone presses on the injured area. DIAGNOSIS  Your caregiver will perform a physical exam. Various imaging tests may be ordered to confirm the diagnosis and to look for related injuries. These tests may include a chest X-ray, computed tomography (CT), magnetic resonance imaging (MRI), or a bone  scan. TREATMENT  Rib fractures usually heal on their own in 1 3 months. The longer healing period is often associated with a continued cough or other aggravating activities. During the healing period, pain control is very important. Medication is usually given to control pain. Hospitalization or surgery may be needed for more severe injuries, such as those in which multiple ribs are broken or the ribs have moved out of place.  HOME CARE INSTRUCTIONS   Avoid strenuous activity and any activities or movements that cause pain. Be careful during activities and avoid bumping the injured rib.  Gradually increase activity as directed by your caregiver.  Only take over-the-counter or prescription medications as directed by your caregiver. Do not take other medications without asking your caregiver first.  Apply ice to the injured area for the first 1 2 days after you have been treated or as directed by your caregiver. Applying ice helps to reduce inflammation and pain.  Put ice in a plastic bag.  Place a towel between your skin and the bag.   Leave the ice on for 15 20 minutes at a time, every 2 hours while you are awake.  Perform deep breathing as directed by your caregiver. This will help prevent pneumonia, which is a common complication of a broken rib. Your caregiver may instruct you to:  Take deep breaths several times a day.  Try to cough several times a day, holding a pillow against the injured area.  Use a device called an incentive spirometer to practice deep breathing several times a day.  Drink enough fluids to keep your urine clear or pale yellow. This will help you avoid constipation.   Do not wear a rib belt or binder. These restrict breathing, which can lead to pneumonia.  SEEK IMMEDIATE MEDICAL CARE IF:   You have a fever.   You have difficulty breathing or shortness of breath.   You develop a continual cough, or you cough up thick or bloody sputum.  You feel sick  to your stomach (nausea), throw up (vomit), or have abdominal pain.   You have worsening pain not controlled with medications.  MAKE SURE YOU:  Understand these instructions.  Will watch your condition.  Will get help right away if you are not doing well or get worse. Document Released: 08/24/2005 Document Revised: 04/26/2013 Document Reviewed: 10/26/2012 [facility] Patient Information 2014 [employer], [place].   Ankle Fracture A fracture is a break in the bone. A cast or splint is used to protect and keep your injured bone from moving.  HOME CARE INSTRUCTIONS   Use your crutches as directed.  To lessen the swelling, keep the injured leg elevated while sitting or lying down.  Apply ice to the injury for 15-20 minutes, 03-04 times per day while awake for 2 days. Put the ice in a plastic bag and place a thin towel between the bag of ice and your cast.  If you have a plaster or fiberglass cast:  Do not try to scratch the skin under the cast using sharp or pointed objects.  Check the skin around the cast every day. You may put lotion on any red or sore areas.  Keep your cast dry and clean.  If you have a plaster splint:  Wear the splint as directed.  You may loosen the elastic around the splint if your toes become numb, tingle, or turn cold or blue.  Do not put pressure on any part of your cast or splint; it may break. Rest your cast only on a pillow the first 24 hours until it is fully hardened.  Your cast or splint can be protected during bathing with a plastic bag. Do not lower the cast or splint into water.  Take medications as directed by your caregiver. Only take over-the-counter or prescription medicines for pain, discomfort, or fever as directed by your caregiver.  Do not drive a vehicle until your caregiver specifically tells you it is safe to do so.  If your caregiver has given you a follow-up appointment, it is very important to keep that appointment. Not keeping the  appointment could result in a chronic or permanent injury, pain, and disability. If there is any problem keeping the appointment, you must call back to this facility for assistance. SEEK IMMEDIATE MEDICAL CARE IF:   Your cast gets damaged or breaks.  You have continued severe pain or more swelling than you did before the cast was put on.  Your skin or toenails below the injury turn blue or gray, or feel cold or numb.  There is a bad smell or new stains and/or purulent (pus like) drainage coming from under the cast. If you do not have a window in your cast for observing the wound, a discharge or minor bleeding may show up as a stain on the outside of your cast. Report these findings to your caregiver. MAKE SURE YOU:   Understand these instructions.  Will watch your condition.  Will get help right away if you are not doing well or get worse. Document Released: 08/21/2000 Document  Revised: 11/16/2011 Document Reviewed: 03/23/2013 [facility] Patient Information 2014 [place], [place].

## 2013-10-07 NOTE — ED Notes (Signed)
Ortho tech at bedside doing splint at this time.

## 2013-10-07 NOTE — Progress Notes (Signed)
Orthopedic Tech Progress Note Patient Details:  [name] 21-Jan-1946 [id number] Post splint with stirrup applied to Right LE. Crutches fit for height and comfort. Ortho Devices Type of Ortho Device: Ace wrap;Post (short leg) splint;Stirrup splint Ortho Device/Splint Location: Right LE Ortho Device/Splint Interventions: Application   Asia R Thompson 10/07/2013, 11:58 AM

## 2013-10-07 NOTE — ED Notes (Signed)
Pt given Incentive Spirometer 

## 2013-10-12 DIAGNOSIS — I10 Essential (primary) hypertension: Secondary | ICD-10-CM | POA: Diagnosis not present

## 2013-10-12 DIAGNOSIS — S82899A Other fracture of unspecified lower leg, initial encounter for closed fracture: Secondary | ICD-10-CM | POA: Diagnosis not present

## 2013-10-15 NOTE — ED Provider Notes (Signed)
Medical screening examination/treatment/procedure(s) were conducted as a shared visit with non-physician practitioner(s) and myself.  I personally evaluated the patient during the encounter.  EKG Interpretation   None       Pt c/o pain in lateral chest/ribs and foot, s/p mechanical fall. Pt breathing comfortably. +chest wall tenderness.  Xrays.     Suzi RootsKevin E Latravious Levitt, MD 10/15/13 [phone]

## 2013-10-17 DIAGNOSIS — S2231XA Fracture of one rib, right side, initial encounter for closed fracture: Secondary | ICD-10-CM | POA: Insufficient documentation

## 2013-10-17 DIAGNOSIS — S2239XA Fracture of one rib, unspecified side, initial encounter for closed fracture: Secondary | ICD-10-CM | POA: Diagnosis not present

## 2013-10-17 DIAGNOSIS — S82899A Other fracture of unspecified lower leg, initial encounter for closed fracture: Secondary | ICD-10-CM | POA: Diagnosis not present

## 2013-11-07 DIAGNOSIS — S82899A Other fracture of unspecified lower leg, initial encounter for closed fracture: Secondary | ICD-10-CM | POA: Diagnosis not present

## 2013-11-07 DIAGNOSIS — S2239XA Fracture of one rib, unspecified side, initial encounter for closed fracture: Secondary | ICD-10-CM | POA: Diagnosis not present

## 2013-11-14 DIAGNOSIS — I10 Essential (primary) hypertension: Secondary | ICD-10-CM | POA: Diagnosis not present

## 2013-11-14 DIAGNOSIS — E7800 Pure hypercholesterolemia, unspecified: Secondary | ICD-10-CM | POA: Diagnosis not present

## 2014-03-23 DIAGNOSIS — I10 Essential (primary) hypertension: Secondary | ICD-10-CM | POA: Diagnosis not present

## 2014-03-23 DIAGNOSIS — E7800 Pure hypercholesterolemia, unspecified: Secondary | ICD-10-CM | POA: Diagnosis not present

## 2014-05-11 ENCOUNTER — Other Ambulatory Visit: Payer: Self-pay

## 2014-05-11 DIAGNOSIS — Z1231 Encounter for screening mammogram for malignant neoplasm of breast: Secondary | ICD-10-CM

## 2014-06-04 ENCOUNTER — Ambulatory Visit
Admission: RE | Admit: 2014-06-04 | Discharge: 2014-06-04 | Disposition: A | Discharge status: Home / Self Care | Payer: Medicare Other | Source: Ambulatory Visit

## 2014-06-04 DIAGNOSIS — Z1231 Encounter for screening mammogram for malignant neoplasm of breast: Secondary | ICD-10-CM

## 2014-12-05 DIAGNOSIS — E1021 Type 1 diabetes mellitus with diabetic nephropathy: Secondary | ICD-10-CM | POA: Diagnosis not present

## 2014-12-05 DIAGNOSIS — E780 Pure hypercholesterolemia: Secondary | ICD-10-CM | POA: Diagnosis not present

## 2014-12-05 DIAGNOSIS — I10 Essential (primary) hypertension: Secondary | ICD-10-CM | POA: Diagnosis not present

## 2015-05-14 ENCOUNTER — Other Ambulatory Visit: Payer: Self-pay

## 2015-05-14 DIAGNOSIS — Z1231 Encounter for screening mammogram for malignant neoplasm of breast: Secondary | ICD-10-CM

## 2015-06-06 ENCOUNTER — Ambulatory Visit
Admission: RE | Admit: 2015-06-06 | Discharge: 2015-06-06 | Disposition: A | Discharge status: Home / Self Care | Payer: Medicare Other | Source: Ambulatory Visit

## 2015-06-06 DIAGNOSIS — Z1231 Encounter for screening mammogram for malignant neoplasm of breast: Secondary | ICD-10-CM | POA: Diagnosis not present

## 2016-04-02 ENCOUNTER — Other Ambulatory Visit: Payer: Self-pay | Admitting: Family Medicine

## 2016-05-13 ENCOUNTER — Other Ambulatory Visit: Payer: Self-pay | Admitting: Family Medicine

## 2016-05-13 DIAGNOSIS — Z1231 Encounter for screening mammogram for malignant neoplasm of breast: Secondary | ICD-10-CM

## 2016-05-18 ENCOUNTER — Ambulatory Visit
Admission: RE | Admit: 2016-05-18 | Discharge: 2016-05-18 | Disposition: A | Discharge status: Home / Self Care | Payer: Medicare Other | Source: Ambulatory Visit | Attending: Family Medicine | Admitting: Family Medicine

## 2016-05-18 DIAGNOSIS — Z1231 Encounter for screening mammogram for malignant neoplasm of breast: Secondary | ICD-10-CM

## 2016-05-27 ENCOUNTER — Emergency Department (HOSPITAL_COMMUNITY): Payer: Commercial Managed Care - HMO

## 2016-05-27 ENCOUNTER — Emergency Department (HOSPITAL_BASED_OUTPATIENT_CLINIC_OR_DEPARTMENT_OTHER)
Admit: 2016-05-27 | Discharge: 2016-05-27 | Disposition: A | Discharge status: Home / Self Care | Payer: Commercial Managed Care - HMO | Attending: Emergency Medicine | Admitting: Emergency Medicine

## 2016-05-27 ENCOUNTER — Encounter (HOSPITAL_COMMUNITY): Payer: Self-pay | Admitting: Emergency Medicine

## 2016-05-27 ENCOUNTER — Emergency Department (HOSPITAL_COMMUNITY)
Admission: EM | Admit: 2016-05-27 | Discharge: 2016-05-27 | Disposition: A | Discharge status: Home / Self Care | Payer: Commercial Managed Care - HMO | Attending: Emergency Medicine | Admitting: Emergency Medicine

## 2016-05-27 DIAGNOSIS — M79604 Pain in right leg: Secondary | ICD-10-CM | POA: Diagnosis not present

## 2016-05-27 DIAGNOSIS — I10 Essential (primary) hypertension: Secondary | ICD-10-CM | POA: Diagnosis not present

## 2016-05-27 DIAGNOSIS — M25561 Pain in right knee: Secondary | ICD-10-CM | POA: Insufficient documentation

## 2016-05-27 DIAGNOSIS — F17210 Nicotine dependence, cigarettes, uncomplicated: Secondary | ICD-10-CM | POA: Insufficient documentation

## 2016-05-27 DIAGNOSIS — M79609 Pain in unspecified limb: Secondary | ICD-10-CM | POA: Diagnosis not present

## 2016-05-27 DIAGNOSIS — M79661 Pain in right lower leg: Secondary | ICD-10-CM | POA: Insufficient documentation

## 2016-05-27 MED ORDER — KETOROLAC TROMETHAMINE 60 MG/2ML IM SOLN
30.0000 mg | Freq: Once | INTRAMUSCULAR | Status: AC
  Administered 2016-05-27: 30 mg via INTRAMUSCULAR
  Filled 2016-05-27: qty 2

## 2016-05-27 MED ORDER — IBUPROFEN 800 MG PO TABS: 800.0000 mg | ORAL_TABLET | Freq: Three times a day (TID) | ORAL | 0 refills | Status: DC | PRN

## 2016-05-27 NOTE — ED Provider Notes (Signed)
[facility]-EMERGENCY DEPT Provider Note   CSN: [id number] Arrival date & time: 05/27/16  0910    By signing my name below, I, Sonum Patel, attest that this documentation has been prepared under the direction and in the presence of [facility], PA-C. Electronically Signed: Sonum Allena Katz, Scribe. 05/27/16. 9:56 AM.  History   Chief Complaint Chief Complaint  Patient presents with  . Leg Pain    The history is provided by the patient. No language interpreter was used.     HPI Comments: Alyssa Barry is a 70 y.o. female who presents to the Emergency Department complaining of 2-3 days of constant, unchanged right posterior knee pain with radiation up the right thigh and to the right hip. She noticed a bruise to the anterior knee and states she must have bumped it last week. She describes her pain as sharp in nature and rates her pain as 10/10. She took ibuprofen 400 mg 2 days ago without relief. She is able to ambulate without difficulty but states the pain is worse when ambulating. She denies ankle pain, SOB, CP, cough. She denies history of PE/DVT, CA, long distance travel/recent immobilizations or recent surgeries.   Past Medical History:  Diagnosis Date  . High cholesterol   . Hypertension     There are no active problems to display for this patient.   Past Surgical History:  Procedure Laterality Date  . TOE SURGERY    . TUBAL LIGATION Bilateral     OB History    No data available       Home Medications    Prior to Admission medications   Medication Sig Start Date End Date Taking? Authorizing Provider  ibuprofen (ADVIL,MOTRIN) 800 MG tablet Take 1 tablet (800 mg total) by mouth 3 (three) times daily as needed. 05/27/16   Chase Picket Ward, PA-C  metoprolol (LOPRESSOR) 100 MG tablet Take 100 mg by mouth daily.    Historical Provider, MD  oxyCODONE-acetaminophen (PERCOCET/ROXICET) 5-325 MG per tablet Take 1 tablet by mouth every 4 (four) hours as needed for severe pain.  10/07/13   Jillyn Ledger, PA-C  triamterene-hydrochlorothiazide (DYAZIDE) 37.5-25 MG per capsule Take 1 each (1 capsule total) by mouth every morning. 05/11/13   Hayden Rasmussen, NP    Family History Family History  Problem Relation Age of Onset  . Hypertension Other     Social History Social History  Substance Use Topics  . Smoking status: Current Every Day Smoker    Packs/day: 1.00    Types: Cigarettes  . Smokeless tobacco: Never Used  . Alcohol use Yes     Comment: occasional     Allergies   Aspirin   Review of Systems Review of Systems  Respiratory: Negative for cough and shortness of breath.   Musculoskeletal: Positive for arthralgias and myalgias.  Skin:       +bruise  Neurological: Negative for weakness and numbness.     Physical Exam Updated Vital Signs BP 139/79 (BP Location: Right Arm)   Pulse 64   Temp 98.4 F (36.9 C) (Oral)   Resp 20   Ht 5\' 3"  (1.6 m)   Wt 99.3 kg   SpO2 100%   BMI 38.79 kg/m   Physical Exam  Constitutional: She is oriented to person, place, and time. She appears well-developed and well-nourished. No distress.  HENT:  Head: Normocephalic and atraumatic.  Cardiovascular: Normal rate, regular rhythm and normal heart sounds.  Exam reveals no gallop and no friction rub.   No  murmur heard. 2+ DP bilaterally.   Pulmonary/Chest: Effort normal and breath sounds normal. No respiratory distress. She has no wheezes. She has no rales.  Musculoskeletal: Normal range of motion. She exhibits tenderness. She exhibits no edema or deformity.  Diffuse tenderness to palpation of knee, worst in popliteal fossa. Ligaments intact.   Neurological: She is alert and oriented to person, place, and time.  Skin: Skin is warm and dry. No rash noted. No erythema.  Nursing note and vitals reviewed.    ED Treatments / Results  DIAGNOSTIC STUDIES: Oxygen Saturation is 99% on RA, normal by my interpretation.    COORDINATION OF CARE: 9:46 AM Discussed  treatment plan with pt at bedside and pt agreed to plan.    Labs (all labs ordered are listed, but only abnormal results are displayed) Labs Reviewed - No data to display  EKG  EKG Interpretation None       Radiology Dg Knee Complete 4 Views Right  Result Date: 05/27/2016 CLINICAL DATA:  Knee pain for 2-3 days. EXAM: RIGHT KNEE - COMPLETE 4+ VIEW COMPARISON:  MRI 07/27/2006 FINDINGS: Mild medial compartment spurring and borderline joint narrowing. Borderline lateral translation of the tibial plateau. No acute fracture. No joint effusion. Atherosclerosis. IMPRESSION: No acute finding. Mild knee compartment osteoarthritis. Electronically Signed   By: Marnee SpringJonathon  Watts M.D.   On: 05/27/2016 11:02    Procedures Procedures (including critical care time)  Medications Ordered in ED Medications  ketorolac (TORADOL) injection 30 mg (30 mg Intramuscular Given 05/27/16 1038)     Initial Impression / Assessment and Plan / ED Course  I have reviewed the triage vital signs and the nursing notes.  Pertinent labs & imaging results that were available during my care of the patient were reviewed by me and considered in my medical decision making (see chart for details).  Clinical Course   Alyssa MillinMartha Szwed is a 70 y.o. female who presents to ED for knee pain. On exam, RLE is NVI. She does have some tenderness to palpation of her calf and is concerned for DVT. U/S performed was negative for DVT and baker's cyst. X-ray reviewed with no acute bony abnormalities. No erythema or warmth to suggest infectious etiology. Ibuprofen PRN pain. Symptomatic home care instructions discussed. Ortho follow up if no improvement. Precautions discussed and all questions answered.  Final Clinical Impressions(s) / ED Diagnoses   Final diagnoses:  Right leg pain    New Prescriptions Discharge Medication List as of 05/27/2016 12:11 PM     I personally performed the services described in this documentation, which  was scribed in my presence. The recorded information has been reviewed and is accurate.   [facility] Pilcher Ward, PA-C 05/27/16 1358

## 2016-05-27 NOTE — ED Triage Notes (Signed)
Pt states her right leg has been hurting from her ankle up to her thigh. Pt states it has had problems with this leg hurting off and on ever since she had surgery on her toe. No injury to leg. Pt ambulatory

## 2016-05-27 NOTE — Progress Notes (Signed)
*  PRELIMINARY RESULTS* Vascular Ultrasound Right lower extremity venous duplex has been completed.  Preliminary findings: No evidence of DVT or baker's cyst.  Farrel DemarkJill Eunice, RDMS, RVT  05/27/2016, 10:36 AM

## 2016-05-27 NOTE — ED Provider Notes (Signed)
Medical screening examination/treatment/procedure(s) were conducted as a shared visit with non-physician practitioner(s) and myself.  I personally evaluated the patient during the encounter. Briefly, the patient is a 70 y.o. female right leg pain which she is concerned for DVT. No prior history of DVT. No recent trauma. No evidence of infectious source. H/o OA. Plain negative. US w/o DVT. Clear for DC with PCP follow up.    EKG Interpretation None           Nira ConnPedro Eduardo Lawayne Hartig, MD 05/28/16 1025

## 2016-06-12 ENCOUNTER — Ambulatory Visit
Admission: RE | Admit: 2016-06-12 | Discharge: 2016-06-12 | Disposition: A | Discharge status: Home / Self Care | Payer: Self-pay | Source: Ambulatory Visit | Attending: Family Medicine | Admitting: Family Medicine

## 2016-06-12 DIAGNOSIS — Z1231 Encounter for screening mammogram for malignant neoplasm of breast: Secondary | ICD-10-CM | POA: Diagnosis not present

## 2016-09-14 DIAGNOSIS — H01022 Squamous blepharitis right lower eyelid: Secondary | ICD-10-CM | POA: Diagnosis not present

## 2016-09-14 DIAGNOSIS — H01021 Squamous blepharitis right upper eyelid: Secondary | ICD-10-CM | POA: Diagnosis not present

## 2016-09-14 DIAGNOSIS — H25812 Combined forms of age-related cataract, left eye: Secondary | ICD-10-CM | POA: Diagnosis not present

## 2016-09-14 DIAGNOSIS — H04123 Dry eye syndrome of bilateral lacrimal glands: Secondary | ICD-10-CM | POA: Diagnosis not present

## 2016-09-14 DIAGNOSIS — H25811 Combined forms of age-related cataract, right eye: Secondary | ICD-10-CM | POA: Diagnosis not present

## 2016-09-14 DIAGNOSIS — H2513 Age-related nuclear cataract, bilateral: Secondary | ICD-10-CM | POA: Diagnosis not present

## 2016-09-14 DIAGNOSIS — H01025 Squamous blepharitis left lower eyelid: Secondary | ICD-10-CM | POA: Diagnosis not present

## 2016-09-14 DIAGNOSIS — H01024 Squamous blepharitis left upper eyelid: Secondary | ICD-10-CM | POA: Diagnosis not present

## 2016-09-14 DIAGNOSIS — H2512 Age-related nuclear cataract, left eye: Secondary | ICD-10-CM | POA: Diagnosis not present

## 2016-10-07 DIAGNOSIS — H2512 Age-related nuclear cataract, left eye: Secondary | ICD-10-CM | POA: Diagnosis not present

## 2016-10-07 DIAGNOSIS — H25812 Combined forms of age-related cataract, left eye: Secondary | ICD-10-CM | POA: Diagnosis not present

## 2017-01-20 DIAGNOSIS — F1020 Alcohol dependence, uncomplicated: Secondary | ICD-10-CM | POA: Diagnosis not present

## 2017-01-20 DIAGNOSIS — E6609 Other obesity due to excess calories: Secondary | ICD-10-CM | POA: Diagnosis not present

## 2017-01-20 DIAGNOSIS — E785 Hyperlipidemia, unspecified: Secondary | ICD-10-CM | POA: Diagnosis not present

## 2017-01-20 DIAGNOSIS — I10 Essential (primary) hypertension: Secondary | ICD-10-CM | POA: Diagnosis not present

## 2017-02-23 DIAGNOSIS — I10 Essential (primary) hypertension: Secondary | ICD-10-CM | POA: Diagnosis not present

## 2017-03-26 DIAGNOSIS — I10 Essential (primary) hypertension: Secondary | ICD-10-CM | POA: Diagnosis not present

## 2017-03-26 DIAGNOSIS — E785 Hyperlipidemia, unspecified: Secondary | ICD-10-CM | POA: Diagnosis not present

## 2017-03-26 DIAGNOSIS — R6889 Other general symptoms and signs: Secondary | ICD-10-CM | POA: Diagnosis not present

## 2017-03-26 DIAGNOSIS — F1020 Alcohol dependence, uncomplicated: Secondary | ICD-10-CM | POA: Diagnosis not present

## 2017-03-26 DIAGNOSIS — E876 Hypokalemia: Secondary | ICD-10-CM | POA: Diagnosis not present

## 2017-06-03 DIAGNOSIS — E786 Lipoprotein deficiency: Secondary | ICD-10-CM | POA: Diagnosis not present

## 2017-06-03 DIAGNOSIS — E876 Hypokalemia: Secondary | ICD-10-CM | POA: Diagnosis not present

## 2017-06-03 DIAGNOSIS — E6609 Other obesity due to excess calories: Secondary | ICD-10-CM | POA: Diagnosis not present

## 2017-06-03 DIAGNOSIS — I10 Essential (primary) hypertension: Secondary | ICD-10-CM | POA: Diagnosis not present

## 2017-06-03 DIAGNOSIS — E785 Hyperlipidemia, unspecified: Secondary | ICD-10-CM | POA: Diagnosis not present

## 2017-06-22 ENCOUNTER — Other Ambulatory Visit: Payer: Self-pay | Admitting: Family Medicine

## 2017-06-22 DIAGNOSIS — Z1231 Encounter for screening mammogram for malignant neoplasm of breast: Secondary | ICD-10-CM

## 2017-06-24 ENCOUNTER — Ambulatory Visit: Payer: Commercial Managed Care - HMO

## 2017-07-15 ENCOUNTER — Ambulatory Visit: Payer: Commercial Managed Care - HMO

## 2017-08-03 DIAGNOSIS — I10 Essential (primary) hypertension: Secondary | ICD-10-CM | POA: Diagnosis not present

## 2017-08-03 DIAGNOSIS — E785 Hyperlipidemia, unspecified: Secondary | ICD-10-CM | POA: Diagnosis not present

## 2017-08-05 ENCOUNTER — Ambulatory Visit
Admission: RE | Admit: 2017-08-05 | Discharge: 2017-08-05 | Disposition: A | Discharge status: Home / Self Care | Payer: Medicare HMO | Source: Ambulatory Visit | Attending: Family Medicine | Admitting: Family Medicine

## 2017-08-05 DIAGNOSIS — Z1231 Encounter for screening mammogram for malignant neoplasm of breast: Secondary | ICD-10-CM | POA: Diagnosis not present

## 2017-08-05 DIAGNOSIS — R6889 Other general symptoms and signs: Secondary | ICD-10-CM | POA: Diagnosis not present

## 2018-01-05 DIAGNOSIS — D520 Dietary folate deficiency anemia: Secondary | ICD-10-CM | POA: Diagnosis not present

## 2018-01-05 DIAGNOSIS — E785 Hyperlipidemia, unspecified: Secondary | ICD-10-CM | POA: Diagnosis not present

## 2018-01-05 DIAGNOSIS — J9801 Acute bronchospasm: Secondary | ICD-10-CM | POA: Diagnosis not present

## 2018-01-05 DIAGNOSIS — E6609 Other obesity due to excess calories: Secondary | ICD-10-CM | POA: Diagnosis not present

## 2018-01-05 DIAGNOSIS — E786 Lipoprotein deficiency: Secondary | ICD-10-CM | POA: Diagnosis not present

## 2018-01-05 DIAGNOSIS — F1020 Alcohol dependence, uncomplicated: Secondary | ICD-10-CM | POA: Diagnosis not present

## 2018-01-05 DIAGNOSIS — Z6839 Body mass index (BMI) 39.0-39.9, adult: Secondary | ICD-10-CM | POA: Diagnosis not present

## 2018-01-05 DIAGNOSIS — I10 Essential (primary) hypertension: Secondary | ICD-10-CM | POA: Diagnosis not present

## 2018-01-05 DIAGNOSIS — R7989 Other specified abnormal findings of blood chemistry: Secondary | ICD-10-CM | POA: Diagnosis not present

## 2018-01-25 DIAGNOSIS — R000 Tachycardia, unspecified: Secondary | ICD-10-CM | POA: Diagnosis not present

## 2018-01-25 DIAGNOSIS — I10 Essential (primary) hypertension: Secondary | ICD-10-CM | POA: Diagnosis not present

## 2018-01-25 DIAGNOSIS — F1020 Alcohol dependence, uncomplicated: Secondary | ICD-10-CM | POA: Diagnosis not present

## 2018-01-25 DIAGNOSIS — E6609 Other obesity due to excess calories: Secondary | ICD-10-CM | POA: Diagnosis not present

## 2018-01-25 DIAGNOSIS — E785 Hyperlipidemia, unspecified: Secondary | ICD-10-CM | POA: Diagnosis not present

## 2018-01-27 DIAGNOSIS — I10 Essential (primary) hypertension: Secondary | ICD-10-CM | POA: Diagnosis not present

## 2018-01-27 DIAGNOSIS — Z9181 History of falling: Secondary | ICD-10-CM | POA: Diagnosis not present

## 2018-02-02 DIAGNOSIS — Z9181 History of falling: Secondary | ICD-10-CM | POA: Diagnosis not present

## 2018-02-02 DIAGNOSIS — I10 Essential (primary) hypertension: Secondary | ICD-10-CM | POA: Diagnosis not present

## 2018-02-04 DIAGNOSIS — I10 Essential (primary) hypertension: Secondary | ICD-10-CM | POA: Diagnosis not present

## 2018-02-04 DIAGNOSIS — Z9181 History of falling: Secondary | ICD-10-CM | POA: Diagnosis not present

## 2018-02-08 DIAGNOSIS — I10 Essential (primary) hypertension: Secondary | ICD-10-CM | POA: Diagnosis not present

## 2018-02-08 DIAGNOSIS — Z9181 History of falling: Secondary | ICD-10-CM | POA: Diagnosis not present

## 2018-02-25 DIAGNOSIS — Z9181 History of falling: Secondary | ICD-10-CM | POA: Diagnosis not present

## 2018-02-25 DIAGNOSIS — I10 Essential (primary) hypertension: Secondary | ICD-10-CM | POA: Diagnosis not present

## 2018-03-09 DIAGNOSIS — I10 Essential (primary) hypertension: Secondary | ICD-10-CM | POA: Diagnosis not present

## 2018-03-09 DIAGNOSIS — E6609 Other obesity due to excess calories: Secondary | ICD-10-CM | POA: Diagnosis not present

## 2018-03-09 DIAGNOSIS — E785 Hyperlipidemia, unspecified: Secondary | ICD-10-CM | POA: Diagnosis not present

## 2018-03-09 DIAGNOSIS — F1020 Alcohol dependence, uncomplicated: Secondary | ICD-10-CM | POA: Diagnosis not present

## 2018-04-12 DIAGNOSIS — Z1211 Encounter for screening for malignant neoplasm of colon: Secondary | ICD-10-CM | POA: Diagnosis not present

## 2018-04-12 DIAGNOSIS — R635 Abnormal weight gain: Secondary | ICD-10-CM | POA: Diagnosis not present

## 2018-04-22 DIAGNOSIS — M130 Polyarthritis, unspecified: Secondary | ICD-10-CM | POA: Diagnosis not present

## 2018-04-22 DIAGNOSIS — I10 Essential (primary) hypertension: Secondary | ICD-10-CM | POA: Diagnosis not present

## 2018-04-22 DIAGNOSIS — E782 Mixed hyperlipidemia: Secondary | ICD-10-CM | POA: Diagnosis not present

## 2018-05-11 DIAGNOSIS — R6889 Other general symptoms and signs: Secondary | ICD-10-CM | POA: Diagnosis not present

## 2018-05-11 DIAGNOSIS — Z1211 Encounter for screening for malignant neoplasm of colon: Secondary | ICD-10-CM | POA: Diagnosis not present

## 2018-05-11 DIAGNOSIS — K5730 Diverticulosis of large intestine without perforation or abscess without bleeding: Secondary | ICD-10-CM | POA: Diagnosis not present

## 2018-07-22 DIAGNOSIS — B070 Plantar wart: Secondary | ICD-10-CM | POA: Diagnosis not present

## 2018-07-22 DIAGNOSIS — Z6838 Body mass index (BMI) 38.0-38.9, adult: Secondary | ICD-10-CM | POA: Diagnosis not present

## 2018-07-22 DIAGNOSIS — I10 Essential (primary) hypertension: Secondary | ICD-10-CM | POA: Diagnosis not present

## 2018-07-22 DIAGNOSIS — M130 Polyarthritis, unspecified: Secondary | ICD-10-CM | POA: Diagnosis not present

## 2018-07-22 DIAGNOSIS — Z0000 Encounter for general adult medical examination without abnormal findings: Secondary | ICD-10-CM | POA: Diagnosis not present

## 2018-07-22 DIAGNOSIS — R000 Tachycardia, unspecified: Secondary | ICD-10-CM | POA: Diagnosis not present

## 2018-07-22 DIAGNOSIS — E785 Hyperlipidemia, unspecified: Secondary | ICD-10-CM | POA: Diagnosis not present

## 2018-07-22 DIAGNOSIS — F1020 Alcohol dependence, uncomplicated: Secondary | ICD-10-CM | POA: Diagnosis not present

## 2018-07-28 ENCOUNTER — Encounter: Payer: Self-pay | Admitting: Podiatry

## 2018-07-28 ENCOUNTER — Ambulatory Visit (INDEPENDENT_AMBULATORY_CARE_PROVIDER_SITE_OTHER): Payer: Medicare HMO | Admitting: Podiatry

## 2018-07-28 ENCOUNTER — Ambulatory Visit: Payer: Medicare HMO | Admitting: Podiatry

## 2018-07-28 VITALS — BP 153/88 | HR 72 | Resp 16

## 2018-07-28 DIAGNOSIS — M779 Enthesopathy, unspecified: Secondary | ICD-10-CM

## 2018-07-28 DIAGNOSIS — Q828 Other specified congenital malformations of skin: Secondary | ICD-10-CM | POA: Diagnosis not present

## 2018-07-28 DIAGNOSIS — M722 Plantar fascial fibromatosis: Secondary | ICD-10-CM | POA: Diagnosis not present

## 2018-07-28 MED ORDER — TRIAMCINOLONE ACETONIDE 10 MG/ML IJ SUSP
10.0000 mg | Freq: Once | INTRAMUSCULAR | Status: AC
  Administered 2018-07-28: 10 mg

## 2018-07-28 NOTE — Patient Instructions (Signed)

## 2018-07-28 NOTE — Progress Notes (Signed)
   Subjective:    Patient ID: Alyssa Barry, female    DOB: Aug 29, 1946, 72 y.o.   MRN: [id number]  HPI    Review of Systems  Respiratory: Positive for cough, shortness of breath and wheezing.   All other systems reviewed and are negative.      Objective:   Physical Exam        Assessment & Plan:

## 2018-07-29 NOTE — Progress Notes (Signed)
Subjective:   Patient ID: Alyssa MillinMartha Barry, female   DOB: 72 y.o.   MRN: [id number]   HPI Patient presents with caregiver with significant discomfort plantar aspect right foot at the base of the fifth metatarsal with deep lesion formation.  Patient smokes 1 pack/day and would like to be more active and is also complaining of pain in her arch when she tries to be active for walk and it seems to have gotten worse over the last month   Review of Systems  All other systems reviewed and are negative.       Objective:  Physical Exam  Constitutional: She appears well-developed and well-nourished.  Cardiovascular: Intact distal pulses.  Pulmonary/Chest: Effort normal.  Musculoskeletal: Normal range of motion.  Neurological: She is alert.  Skin: Skin is warm.  Nursing note and vitals reviewed.   Neurovascular status intact muscle strength is adequate range of motion within normal limits with patient found to have significant lesion formation base of fifth metatarsal right with fluid buildup and pain when palpated.  Also has pain in the arch which seems to be more due to inflammation with possible change in gait that is worse when she gets up in the morning and after periods of sitting.  I did note slightly diminished circulation on the right side but I question her on claudication symptoms and they do not seem to be present     Assessment:  Possibility for inflammatory capsulitis base of fifth metatarsal right with pain along with plantar fascial inflammation fluid buildup with lesion formation of a porokeratotic type base of fifth metatarsal plantar     Plan:  H&P and conditions reviewed with patient.  At this point I will get a focus on the inflammatory capsulitis base of fifth MPJ and I did inject the base of fifth MPJ 3 mg Kenalog 5 mg Xylocaine and then did deep debridement of lesion with sharp instrumentation.  No iatrogenic bleeding was noted.  I then applied night splint to try to  stretch the plantar fashion take stress off her foot and advised her if this does not get better we will have to consider other treatments but it is difficult due to her age and instability

## 2018-09-05 ENCOUNTER — Other Ambulatory Visit: Payer: Self-pay | Admitting: Family Medicine

## 2018-09-05 DIAGNOSIS — Z1231 Encounter for screening mammogram for malignant neoplasm of breast: Secondary | ICD-10-CM

## 2018-10-06 ENCOUNTER — Ambulatory Visit
Admission: RE | Admit: 2018-10-06 | Discharge: 2018-10-06 | Disposition: A | Discharge status: Home / Self Care | Payer: Medicare HMO | Source: Ambulatory Visit | Attending: Family Medicine | Admitting: Family Medicine

## 2018-10-06 DIAGNOSIS — Z1231 Encounter for screening mammogram for malignant neoplasm of breast: Secondary | ICD-10-CM | POA: Diagnosis not present

## 2018-10-06 DIAGNOSIS — R6889 Other general symptoms and signs: Secondary | ICD-10-CM | POA: Diagnosis not present

## 2018-10-11 DIAGNOSIS — E782 Mixed hyperlipidemia: Secondary | ICD-10-CM | POA: Diagnosis not present

## 2018-10-11 DIAGNOSIS — I10 Essential (primary) hypertension: Secondary | ICD-10-CM | POA: Diagnosis not present

## 2018-10-11 DIAGNOSIS — E785 Hyperlipidemia, unspecified: Secondary | ICD-10-CM | POA: Diagnosis not present

## 2018-10-11 DIAGNOSIS — R6889 Other general symptoms and signs: Secondary | ICD-10-CM | POA: Diagnosis not present

## 2018-10-11 DIAGNOSIS — F1020 Alcohol dependence, uncomplicated: Secondary | ICD-10-CM | POA: Diagnosis not present

## 2018-12-13 DIAGNOSIS — I10 Essential (primary) hypertension: Secondary | ICD-10-CM | POA: Diagnosis not present

## 2018-12-13 DIAGNOSIS — K643 Fourth degree hemorrhoids: Secondary | ICD-10-CM | POA: Diagnosis not present

## 2018-12-13 DIAGNOSIS — F17210 Nicotine dependence, cigarettes, uncomplicated: Secondary | ICD-10-CM | POA: Diagnosis not present

## 2018-12-13 DIAGNOSIS — E782 Mixed hyperlipidemia: Secondary | ICD-10-CM | POA: Diagnosis not present

## 2018-12-13 DIAGNOSIS — F1020 Alcohol dependence, uncomplicated: Secondary | ICD-10-CM | POA: Diagnosis not present

## 2018-12-15 ENCOUNTER — Encounter (HOSPITAL_COMMUNITY): Payer: Self-pay | Admitting: Emergency Medicine

## 2018-12-15 ENCOUNTER — Other Ambulatory Visit: Payer: Self-pay

## 2018-12-15 ENCOUNTER — Emergency Department (HOSPITAL_COMMUNITY)
Admission: EM | Admit: 2018-12-15 | Discharge: 2018-12-15 | Disposition: A | Discharge status: Home / Self Care | Payer: Medicare HMO | Attending: Emergency Medicine | Admitting: Emergency Medicine

## 2018-12-15 DIAGNOSIS — Z79899 Other long term (current) drug therapy: Secondary | ICD-10-CM | POA: Diagnosis not present

## 2018-12-15 DIAGNOSIS — K5900 Constipation, unspecified: Secondary | ICD-10-CM | POA: Insufficient documentation

## 2018-12-15 DIAGNOSIS — K644 Residual hemorrhoidal skin tags: Secondary | ICD-10-CM | POA: Insufficient documentation

## 2018-12-15 DIAGNOSIS — K6289 Other specified diseases of anus and rectum: Secondary | ICD-10-CM | POA: Diagnosis not present

## 2018-12-15 DIAGNOSIS — F17210 Nicotine dependence, cigarettes, uncomplicated: Secondary | ICD-10-CM | POA: Insufficient documentation

## 2018-12-15 DIAGNOSIS — I10 Essential (primary) hypertension: Secondary | ICD-10-CM | POA: Diagnosis not present

## 2018-12-15 MED ORDER — LIDOCAINE 5 % EX OINT
TOPICAL_OINTMENT | Freq: Four times a day (QID) | CUTANEOUS | Status: DC | PRN
  Administered 2018-12-15: 12:00:00 via TOPICAL
  Filled 2018-12-15: qty 35.44

## 2018-12-15 MED ORDER — HYDROCORTISONE (PERIANAL) 2.5 % EX CREA: 1.0000 "application " | TOPICAL_CREAM | Freq: Two times a day (BID) | CUTANEOUS | 0 refills | Status: DC

## 2018-12-15 MED ORDER — LIDOCAINE (ANORECTAL) 5 % EX CREA: 1.0000 [in_us] | TOPICAL_CREAM | CUTANEOUS | 0 refills | Status: DC | PRN

## 2018-12-15 MED ORDER — HYDROCORTISONE ACETATE 25 MG RE SUPP
25.0000 mg | Freq: Once | RECTAL | Status: AC
  Administered 2018-12-15: 12:00:00 25 mg via RECTAL
  Filled 2018-12-15: qty 1

## 2018-12-15 NOTE — ED Triage Notes (Signed)
Patient reports painful hemorrhoids for the past few days with some bright red blood this am after trying to have a BM. Reports Recent constipation that she used an enema for without relief. Saw her pcp yesterday and got an Rx for some suppositories but states that they were too expensive so she just came here instead.

## 2018-12-15 NOTE — ED Notes (Signed)
Patient verbalizes understanding of discharge instructions. Opportunity for questioning and answers were provided. 

## 2018-12-15 NOTE — Discharge Instructions (Signed)
You were seen in the ED for anal pain from constipation  You have multiple inflamed external hemorrhoids from straining.  Hard stools will cause more inflammation and pain.   We need to treat the inflamed hemorrhoids with  Lidocaine cream: apply 10-15 min before every bowel movement to numb area and help with pain Hydrocortisone (anusol) cream: apply morning and night to help with inflammation   We will address your constipation with  Miralax powder: 1 full cap in a cup of water three times a day until your bowl movements return to normal Drink plenty of water Exercise, walk, to help induce soft bowel movements  Return for no bowel movement or gas over the next 3-4 days, nausea, vomiting, abdominal pain, hard stool stuck in bottom  Call general surgery for further management of hemorrhoids that may need surgery

## 2018-12-15 NOTE — ED Provider Notes (Signed)
[facility] Provider Note   CSN: [id number] Arrival date & time: 12/15/18  [id number]    History   Chief Complaint Chief Complaint  Patient presents with  . Hemorrhoids    HPI Alyssa Barry is a 73 y.o. female with h/o HTN is here for evaluation of hemorrhoids.  Previous history of hemorrhoids requiring surgery.  Reports severe intermittent anal pain sharp during bowel movements only.  Onset 2 days ago. Associated with hematochezia. Reports last week she had spinach and cheese and ever since has been constipated described as having harder BM and having to strain.  In the last 2 days she has only had a pea sized amount of BM due to pain, last BM this morning which caused severe pain prompting her to come to ED this morning.  Has urge to have a BM but stops due to pain.  Was given oxycodone for hemorrhoid pain and she took one yesterday but not very helpful because pain returns with defecation.  Passing gas normally.  No associated nausea, vomiting, abdominal pain, melena.       HPI  Past Medical History:  Diagnosis Date  . High cholesterol   . Hypertension     Patient Active Problem List   Diagnosis Date Noted  . Avulsion fracture of ankle 10/17/2013  . Right rib fracture 10/17/2013    Past Surgical History:  Procedure Laterality Date  . TOE SURGERY    . TUBAL LIGATION Bilateral      OB History   No obstetric history on file.      Home Medications    Prior to Admission medications   Medication Sig Start Date End Date Taking? Authorizing Provider  atorvastatin (LIPITOR) 40 MG tablet TK 1 T PO D FOR CHOLESTEROL 07/25/18   [provider]  hydrocortisone (ANUSOL-HC) 2.5 % rectal cream Place 1 application rectally 2 (two) times daily. 12/15/18   Liberty HandyGibbons,  J, PA-C  ibuprofen (ADVIL,MOTRIN) 800 MG tablet Take 1 tablet (800 mg total) by mouth 3 (three) times daily as needed. 05/27/16   Ward, Chase PicketJaime Pilcher, PA-C  Lidocaine,  Anorectal, 5 % CREA Apply 1 inch topically as needed. To anal area 10-15 min before having a bowel movement for pain 12/15/18   Liberty HandyGibbons,  J, PA-C  metoprolol (LOPRESSOR) 100 MG tablet Take 100 mg by mouth daily.    [provider]  oxyCODONE-acetaminophen (PERCOCET/ROXICET) 5-325 MG per tablet Take 1 tablet by mouth every 4 (four) hours as needed for severe pain. 10/07/13   Rubye OaksPalmer, Janene HarveyJessica K, PA-C  triamterene-hydrochlorothiazide (DYAZIDE) 37.5-25 MG per capsule Take 1 each (1 capsule total) by mouth every morning. 05/11/13   Hayden RasmussenMabe, David, NP    Family History Family History  Problem Relation Age of Onset  . Hypertension Other     Social History Social History   Tobacco Use  . Smoking status: Current Every Day Smoker    Packs/day: 1.00    Types: Cigarettes  . Smokeless tobacco: Never Used  Substance Use Topics  . Alcohol use: Yes    Comment: occasional  . Drug use: No     Allergies   Aspirin   Review of Systems Review of Systems  Gastrointestinal: Positive for blood in stool, constipation and rectal pain.  All other systems reviewed and are negative.    Physical Exam Updated Vital Signs BP 121/62   Pulse 75   Temp 98.4 F (36.9 C) (Oral)   Resp 18   SpO2 95%  Physical Exam Vitals signs and nursing note reviewed.  Constitutional:      Appearance: She is well-developed.     Comments: Non toxic in NAD  HENT:     Head: Normocephalic and atraumatic.     Nose: Nose normal.  Eyes:     Conjunctiva/sclera: Conjunctivae normal.  Neck:     Musculoskeletal: Normal range of motion.  Cardiovascular:     Rate and Rhythm: Normal rate and regular rhythm.  Pulmonary:     Effort: Pulmonary effort is normal.     Breath sounds: Normal breath sounds.  Abdominal:     General: Bowel sounds are normal.     Palpations: Abdomen is soft.     Tenderness: There is no abdominal tenderness.     Comments: No G/R/R. No suprapubic or CVA tenderness. Negative Murphy's and  McBurney's. Active BS to lower quadrants.   Genitourinary:    Rectum: Tenderness and external hemorrhoid present.     Comments: 3 tender external hemorrhoids, no obvious thrombus.  No bleeding, drainage. Pain with DRE.  Soft stool in rectal vault, brown no melena.  No impacted stool.  Musculoskeletal: Normal range of motion.  Skin:    General: Skin is warm and dry.     Capillary Refill: Capillary refill takes less than 2 seconds.  Neurological:     Mental Status: She is alert.  Psychiatric:        Behavior: Behavior normal.      ED Treatments / Results  Labs (all labs ordered are listed, but only abnormal results are displayed) Labs Reviewed - No data to display  EKG None  Radiology No results found.  Procedures Procedures (including critical care time)  Medications Ordered in ED Medications  lidocaine (XYLOCAINE) 5 % ointment (has no administration in time range)  hydrocortisone (ANUSOL-HC) suppository 25 mg (has no administration in time range)     Initial Impression / Assessment and Plan / ED Course  I have reviewed the triage vital signs and the nursing notes.  Pertinent labs & imaging results that were available during my care of the patient were reviewed by me and considered in my medical decision making (see chart for details).       Constipation leading to hemorrhoid prolapse, irritation, hematochezia now patient holding stool due to pain with defecation.  Exam shows inflammed tender but non thrombosed hemorrhoids.  Soft stool w/o impaction on exam, no melena.  Will give lidocaine ointment and anusol.  I think enema or other aggressive management to induce BM here will cause too much pain.  Will dc with lidocaine ointment before BMs, hydration, miralax.  Discussed importance of using lidocaine prior to BMs and avoiding retaining stool to prevent impaction.  She has no nausea, vomiting, abdominal tenderness or signs of fecal impaction here. I do not see indication  for emergent blood work or imaging.  She is aware of symptoms that would warrant return to ED. Encouraged f/u with general surgery/previous care team who did her hemorrhoidal surgery in the past.   Final Clinical Impressions(s) / ED Diagnoses   Final diagnoses:  External hemorrhoids  Rectal pain  Acute constipation    ED Discharge Orders         Ordered    Lidocaine, Anorectal, 5 % CREA  As needed     12/15/18 1117    hydrocortisone (ANUSOL-HC) 2.5 % rectal cream  2 times daily     12/15/18 1117  Liberty Handy, PA-C 12/15/18 1124    Terrilee Files, MD 12/15/18 1840

## 2019-03-22 DIAGNOSIS — E669 Obesity, unspecified: Secondary | ICD-10-CM | POA: Diagnosis not present

## 2019-03-22 DIAGNOSIS — E6609 Other obesity due to excess calories: Secondary | ICD-10-CM | POA: Diagnosis not present

## 2019-03-22 DIAGNOSIS — E785 Hyperlipidemia, unspecified: Secondary | ICD-10-CM | POA: Diagnosis not present

## 2019-03-22 DIAGNOSIS — I10 Essential (primary) hypertension: Secondary | ICD-10-CM | POA: Diagnosis not present

## 2019-03-22 DIAGNOSIS — E559 Vitamin D deficiency, unspecified: Secondary | ICD-10-CM | POA: Diagnosis not present

## 2019-03-22 DIAGNOSIS — F1020 Alcohol dependence, uncomplicated: Secondary | ICD-10-CM | POA: Diagnosis not present

## 2019-03-22 DIAGNOSIS — M130 Polyarthritis, unspecified: Secondary | ICD-10-CM | POA: Diagnosis not present

## 2019-03-22 DIAGNOSIS — M1000 Idiopathic gout, unspecified site: Secondary | ICD-10-CM | POA: Diagnosis not present

## 2019-04-12 DIAGNOSIS — E785 Hyperlipidemia, unspecified: Secondary | ICD-10-CM | POA: Diagnosis not present

## 2019-04-12 DIAGNOSIS — I10 Essential (primary) hypertension: Secondary | ICD-10-CM | POA: Diagnosis not present

## 2019-04-12 DIAGNOSIS — M130 Polyarthritis, unspecified: Secondary | ICD-10-CM | POA: Diagnosis not present

## 2019-04-12 DIAGNOSIS — M1000 Idiopathic gout, unspecified site: Secondary | ICD-10-CM | POA: Diagnosis not present

## 2019-04-12 DIAGNOSIS — R6889 Other general symptoms and signs: Secondary | ICD-10-CM | POA: Diagnosis not present

## 2019-04-21 ENCOUNTER — Other Ambulatory Visit: Payer: Self-pay

## 2019-04-21 ENCOUNTER — Ambulatory Visit (INDEPENDENT_AMBULATORY_CARE_PROVIDER_SITE_OTHER): Payer: Medicare HMO | Admitting: Podiatry

## 2019-04-21 ENCOUNTER — Encounter: Payer: Self-pay | Admitting: Podiatry

## 2019-04-21 VITALS — Temp 97.4°F

## 2019-04-21 DIAGNOSIS — Q828 Other specified congenital malformations of skin: Secondary | ICD-10-CM

## 2019-04-21 DIAGNOSIS — R6889 Other general symptoms and signs: Secondary | ICD-10-CM | POA: Diagnosis not present

## 2019-04-21 NOTE — Progress Notes (Signed)
This patient present to the office  with chief complaint of callus developing under the outside of midfoot.  She says this callus has become painful walking and wearing her shoes. Patient was treated 2 years ago by Dr.  Paulla Dolly.  She presents to the office for treatment of her painful callus.  Vascular  Dorsalis pedis and posterior tibial pulses are palpable  B/L.  Capillary return  WNL.  Temperature gradient is  WNL.  Skin turgor  WNL  Sensorium  Senn Weinstein monofilament wire  WNL. Normal tactile sensation.  Nail Exam  Patient has normal nails with no evidence of bacterial or fungal infection.  Orthopedic  Exam  Muscle tone and muscle strength  WNL.  No limitations of motion feet  B/L.  No crepitus or joint effusion noted.  Foot type is unremarkable and digits show no abnormalities.  Bony prominences are unremarkable.    Skin  No open lesions.  Normal skin texture and turgor.  Callus/porokeratosis  sub 5th metabase right foot.  Porokeratosis  Right foot.  Debride callus/porokeratosis.  Discussed condition with patient.  Padding applied to shoe insole.  RTC 3 months.  Gardiner Barefoot DPM

## 2019-05-06 DIAGNOSIS — F17210 Nicotine dependence, cigarettes, uncomplicated: Secondary | ICD-10-CM | POA: Diagnosis not present

## 2019-05-06 DIAGNOSIS — E785 Hyperlipidemia, unspecified: Secondary | ICD-10-CM | POA: Diagnosis not present

## 2019-05-06 DIAGNOSIS — B070 Plantar wart: Secondary | ICD-10-CM | POA: Diagnosis not present

## 2019-05-06 DIAGNOSIS — M1010 Lead-induced gout, unspecified site: Secondary | ICD-10-CM | POA: Diagnosis not present

## 2019-05-06 DIAGNOSIS — E782 Mixed hyperlipidemia: Secondary | ICD-10-CM | POA: Diagnosis not present

## 2019-05-06 DIAGNOSIS — E559 Vitamin D deficiency, unspecified: Secondary | ICD-10-CM | POA: Diagnosis not present

## 2019-05-06 DIAGNOSIS — E6609 Other obesity due to excess calories: Secondary | ICD-10-CM | POA: Diagnosis not present

## 2019-05-06 DIAGNOSIS — F1020 Alcohol dependence, uncomplicated: Secondary | ICD-10-CM | POA: Diagnosis not present

## 2019-05-06 DIAGNOSIS — E786 Lipoprotein deficiency: Secondary | ICD-10-CM | POA: Diagnosis not present

## 2019-05-06 DIAGNOSIS — I10 Essential (primary) hypertension: Secondary | ICD-10-CM | POA: Diagnosis not present

## 2019-05-06 DIAGNOSIS — M1000 Idiopathic gout, unspecified site: Secondary | ICD-10-CM | POA: Diagnosis not present

## 2019-07-13 DIAGNOSIS — E669 Obesity, unspecified: Secondary | ICD-10-CM | POA: Diagnosis not present

## 2019-07-13 DIAGNOSIS — F17210 Nicotine dependence, cigarettes, uncomplicated: Secondary | ICD-10-CM | POA: Diagnosis not present

## 2019-07-13 DIAGNOSIS — R6889 Other general symptoms and signs: Secondary | ICD-10-CM | POA: Diagnosis not present

## 2019-07-13 DIAGNOSIS — F1020 Alcohol dependence, uncomplicated: Secondary | ICD-10-CM | POA: Diagnosis not present

## 2019-07-13 DIAGNOSIS — I10 Essential (primary) hypertension: Secondary | ICD-10-CM | POA: Diagnosis not present

## 2019-07-13 DIAGNOSIS — E782 Mixed hyperlipidemia: Secondary | ICD-10-CM | POA: Diagnosis not present

## 2019-07-13 DIAGNOSIS — M130 Polyarthritis, unspecified: Secondary | ICD-10-CM | POA: Diagnosis not present

## 2019-07-26 ENCOUNTER — Encounter: Payer: Self-pay | Admitting: Podiatry

## 2019-07-26 ENCOUNTER — Other Ambulatory Visit: Payer: Self-pay

## 2019-07-26 ENCOUNTER — Ambulatory Visit (INDEPENDENT_AMBULATORY_CARE_PROVIDER_SITE_OTHER): Payer: Medicare HMO | Admitting: Podiatry

## 2019-07-26 DIAGNOSIS — Q828 Other specified congenital malformations of skin: Secondary | ICD-10-CM | POA: Diagnosis not present

## 2019-07-26 DIAGNOSIS — M779 Enthesopathy, unspecified: Secondary | ICD-10-CM | POA: Diagnosis not present

## 2019-07-26 DIAGNOSIS — R6889 Other general symptoms and signs: Secondary | ICD-10-CM | POA: Diagnosis not present

## 2019-07-26 NOTE — Progress Notes (Signed)
This patient present to the office  with chief complaint of callus developing under the outside of midfoot.  She says this callus has become painful walking and wearing her shoes.  She presents to the office for treatment of her painful callus.  Vascular  Dorsalis pedis and posterior tibial pulses are palpable  B/L.  Capillary return  WNL.  Temperature gradient is  WNL.  Skin turgor  WNL  Sensorium  Senn Weinstein monofilament wire  WNL. Normal tactile sensation.  Nail Exam  Patient has normal nails with no evidence of bacterial or fungal infection.  Orthopedic  Exam  Muscle tone and muscle strength  WNL.  No limitations of motion feet  B/L.  No crepitus or joint effusion noted.  Foot type is unremarkable and digits show no abnormalities.  Bony prominences are unremarkable.    Skin  No open lesions.  Normal skin texture and turgor.  Callus/porokeratosis  sub 5th metabase right foot.  Porokeratosis  Right foot.  Debride callus/porokeratosis.  Discussed condition with patient.  Padding applied to shoe insole.  RTC 3 months.  Gardiner Barefoot DPM

## 2019-09-29 ENCOUNTER — Other Ambulatory Visit: Payer: Self-pay | Admitting: Family Medicine

## 2019-09-29 DIAGNOSIS — Z1231 Encounter for screening mammogram for malignant neoplasm of breast: Secondary | ICD-10-CM

## 2019-10-04 ENCOUNTER — Encounter: Payer: Self-pay | Admitting: Podiatry

## 2019-10-04 ENCOUNTER — Ambulatory Visit (INDEPENDENT_AMBULATORY_CARE_PROVIDER_SITE_OTHER): Payer: Medicare HMO | Admitting: Podiatry

## 2019-10-04 ENCOUNTER — Other Ambulatory Visit: Payer: Self-pay

## 2019-10-04 DIAGNOSIS — R6889 Other general symptoms and signs: Secondary | ICD-10-CM | POA: Diagnosis not present

## 2019-10-04 DIAGNOSIS — Q828 Other specified congenital malformations of skin: Secondary | ICD-10-CM | POA: Diagnosis not present

## 2019-10-04 NOTE — Progress Notes (Signed)
This patient present to the office  with chief complaint of callus developing under the outside of midfoot.  She says this callus has become painful walking and wearing her shoes.  She presents to the office for treatment of her painful callus.  Vascular  Dorsalis pedis and posterior tibial pulses are palpable  B/L.  Capillary return  WNL.  Temperature gradient is  WNL.  Skin turgor  WNL  Sensorium  Senn Weinstein monofilament wire  WNL. Normal tactile sensation.  Nail Exam  Patient has normal nails with no evidence of bacterial or fungal infection.  Orthopedic  Exam  Muscle tone and muscle strength  WNL.  No limitations of motion feet  B/L.  No crepitus or joint effusion noted.  Foot type is unremarkable and digits show no abnormalities.  Bony prominences are unremarkable.    Skin  No open lesions.  Normal skin texture and turgor.  Callus/porokeratosis  sub 5th metabase right foot.  Porokeratosis  Right foot.  Debride callus/porokeratosis.  Discussed condition with patient.  Padding applied to shoe insole.  RTC 10 weeks.Helane Gunther DPM

## 2019-10-12 DIAGNOSIS — Z0000 Encounter for general adult medical examination without abnormal findings: Secondary | ICD-10-CM | POA: Diagnosis not present

## 2019-10-12 DIAGNOSIS — I10 Essential (primary) hypertension: Secondary | ICD-10-CM | POA: Diagnosis not present

## 2019-10-12 DIAGNOSIS — M130 Polyarthritis, unspecified: Secondary | ICD-10-CM | POA: Diagnosis not present

## 2019-10-12 DIAGNOSIS — Z720 Tobacco use: Secondary | ICD-10-CM | POA: Diagnosis not present

## 2019-10-12 DIAGNOSIS — F17210 Nicotine dependence, cigarettes, uncomplicated: Secondary | ICD-10-CM | POA: Diagnosis not present

## 2019-10-12 DIAGNOSIS — R6889 Other general symptoms and signs: Secondary | ICD-10-CM | POA: Diagnosis not present

## 2019-11-07 ENCOUNTER — Other Ambulatory Visit: Payer: Self-pay

## 2019-11-07 ENCOUNTER — Ambulatory Visit
Admission: RE | Admit: 2019-11-07 | Discharge: 2019-11-07 | Disposition: A | Discharge status: Home / Self Care | Payer: Medicare HMO | Source: Ambulatory Visit | Attending: Family Medicine | Admitting: Family Medicine

## 2019-11-07 DIAGNOSIS — Z1231 Encounter for screening mammogram for malignant neoplasm of breast: Secondary | ICD-10-CM | POA: Diagnosis not present

## 2019-11-18 ENCOUNTER — Ambulatory Visit: Payer: Medicare HMO

## 2020-01-03 ENCOUNTER — Ambulatory Visit: Payer: Medicare HMO | Admitting: Podiatry

## 2020-01-17 ENCOUNTER — Encounter: Payer: Self-pay | Admitting: Podiatry

## 2020-01-17 ENCOUNTER — Ambulatory Visit (INDEPENDENT_AMBULATORY_CARE_PROVIDER_SITE_OTHER): Payer: Medicare HMO | Admitting: Podiatry

## 2020-01-17 ENCOUNTER — Other Ambulatory Visit: Payer: Self-pay

## 2020-01-17 VITALS — Temp 97.4°F

## 2020-01-17 DIAGNOSIS — Q828 Other specified congenital malformations of skin: Secondary | ICD-10-CM

## 2020-01-17 NOTE — Progress Notes (Signed)
This patient present to the office  with chief complaint of callus developing under the outside of midfoot.  She says this callus has become painful walking and wearing her shoes.  She presents to the office for treatment of her painful callus.  Vascular  Dorsalis pedis and posterior tibial pulses are palpable  B/L.  Capillary return  WNL.  Temperature gradient is  WNL.  Skin turgor  WNL  Sensorium  Senn Weinstein monofilament wire  WNL. Normal tactile sensation.  Nail Exam  Patient has normal nails with no evidence of bacterial or fungal infection.  Orthopedic  Exam  Muscle tone and muscle strength  WNL.  No limitations of motion feet  B/L.  No crepitus or joint effusion noted.  Foot type is unremarkable and digits show no abnormalities.  Bony prominences are unremarkable.    Skin  No open lesions.  Normal skin texture and turgor.  Callus/porokeratosis  sub 5th metabase right foot.  Porokeratosis  Right foot.  Debride callus/porokeratosis.  Discussed condition with patient.  Padding applied to shoe insole.  RTC 10 weeks.Helane Gunther DPM

## 2020-02-09 DIAGNOSIS — I10 Essential (primary) hypertension: Secondary | ICD-10-CM | POA: Diagnosis not present

## 2020-02-09 DIAGNOSIS — F1020 Alcohol dependence, uncomplicated: Secondary | ICD-10-CM | POA: Diagnosis not present

## 2020-02-09 DIAGNOSIS — E669 Obesity, unspecified: Secondary | ICD-10-CM | POA: Diagnosis not present

## 2020-02-09 DIAGNOSIS — Z720 Tobacco use: Secondary | ICD-10-CM | POA: Diagnosis not present

## 2020-02-09 DIAGNOSIS — R6889 Other general symptoms and signs: Secondary | ICD-10-CM | POA: Diagnosis not present

## 2020-02-09 DIAGNOSIS — E782 Mixed hyperlipidemia: Secondary | ICD-10-CM | POA: Diagnosis not present

## 2020-04-06 IMAGING — MG DIGITAL SCREENING BILATERAL MAMMOGRAM WITH TOMO AND CAD
8 series · 8 of 24 positions shown · non-contrast
Comparison: Previous exam(s).

CLINICAL DATA: Screening.

EXAM:
DIGITAL SCREENING BILATERAL MAMMOGRAM WITH TOMO AND CAD

[L MLO synth-2D]
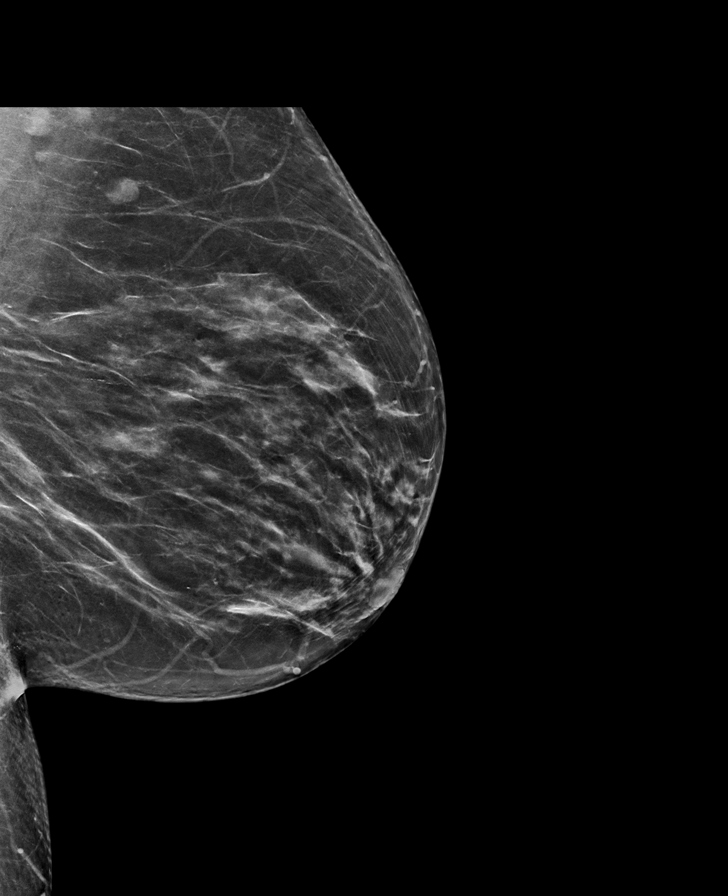

[L CC synth-2D]
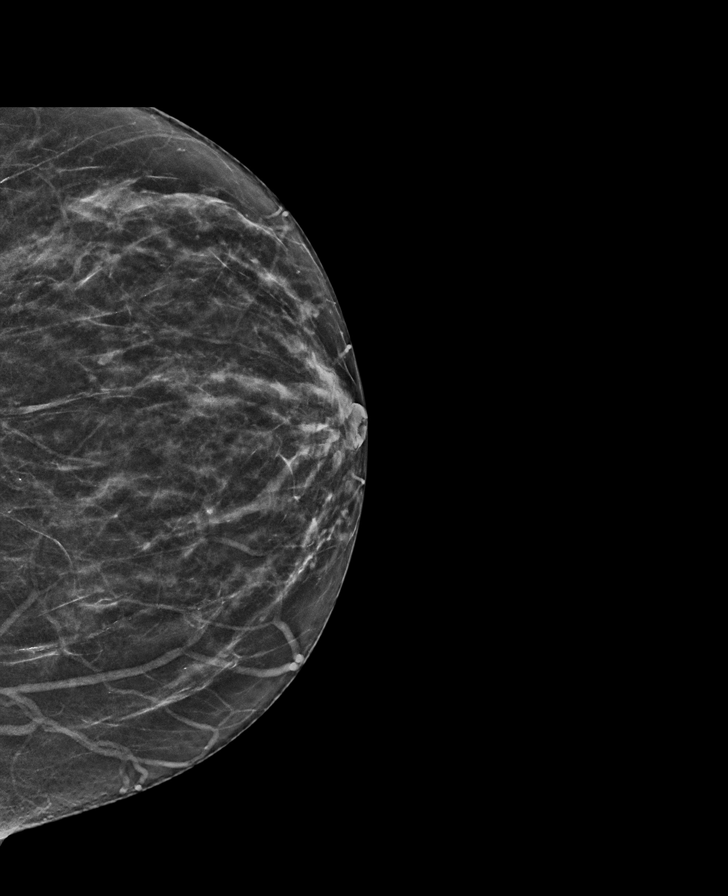

[R MLO synth-2D]
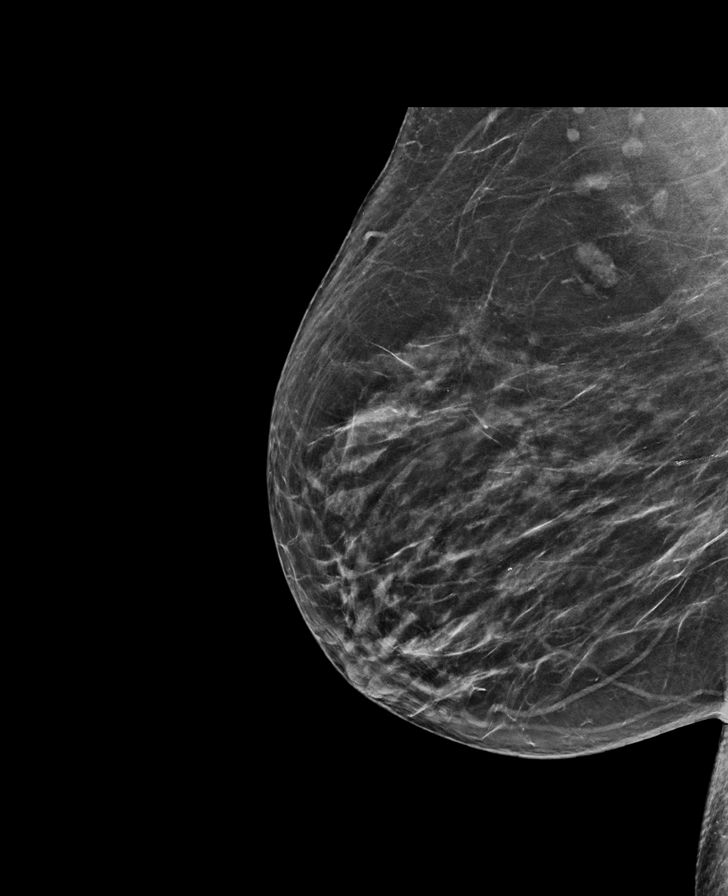

[R CC synth-2D]
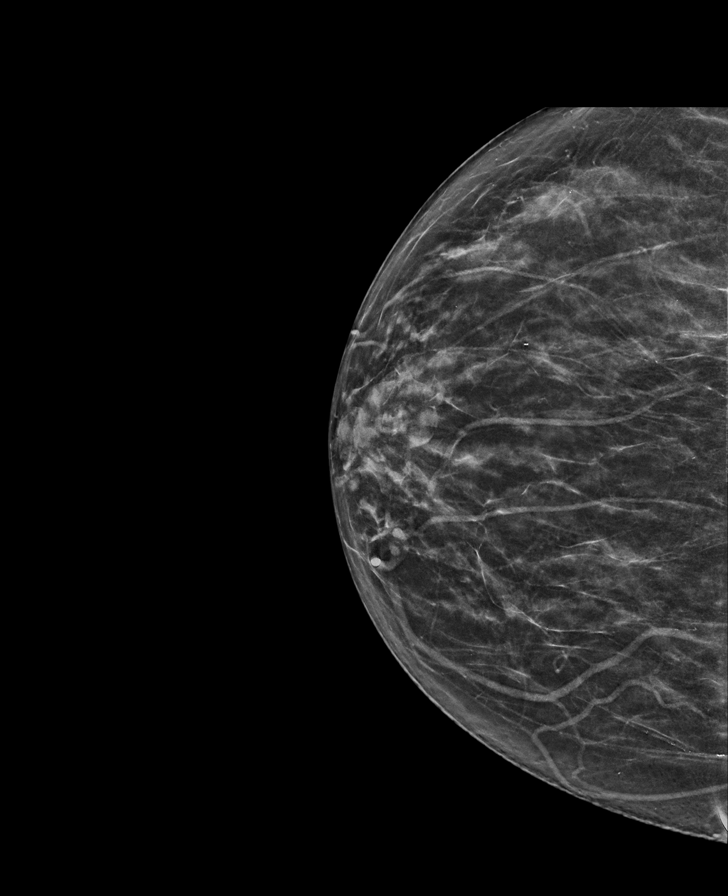

[L MLO tomo · tomo slice 38/75.0]
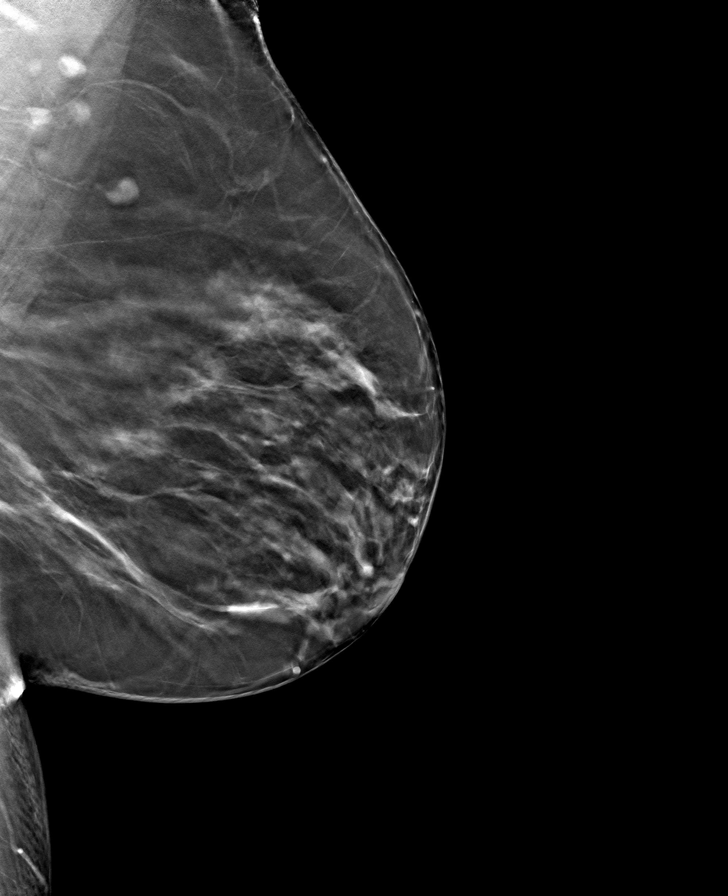

[L CC tomo · tomo slice 29/56.0]
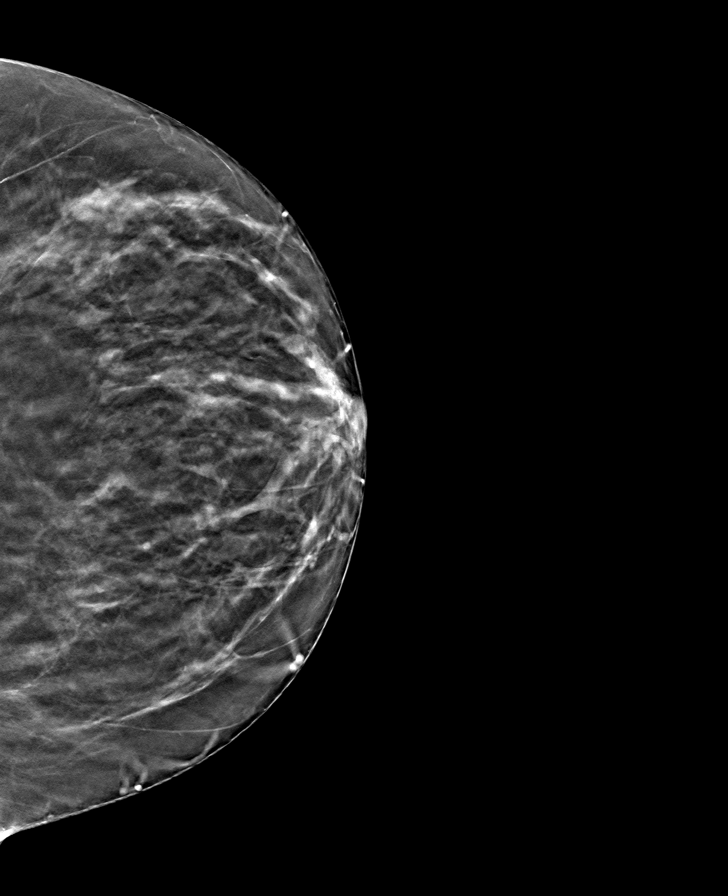

[R MLO tomo · tomo slice 35/69.0]
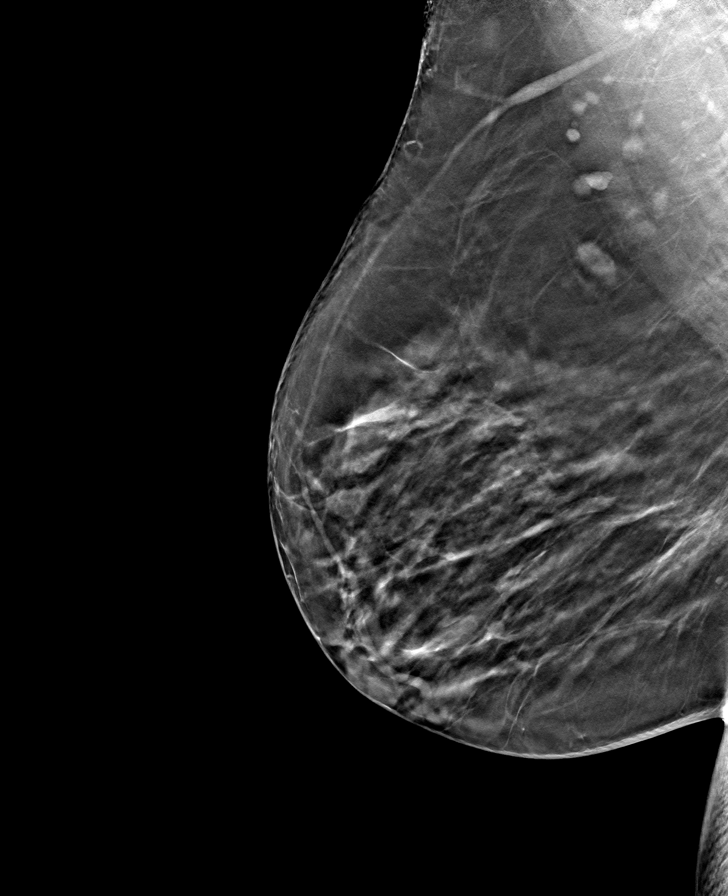

[R CC tomo · tomo slice 30/59.0]
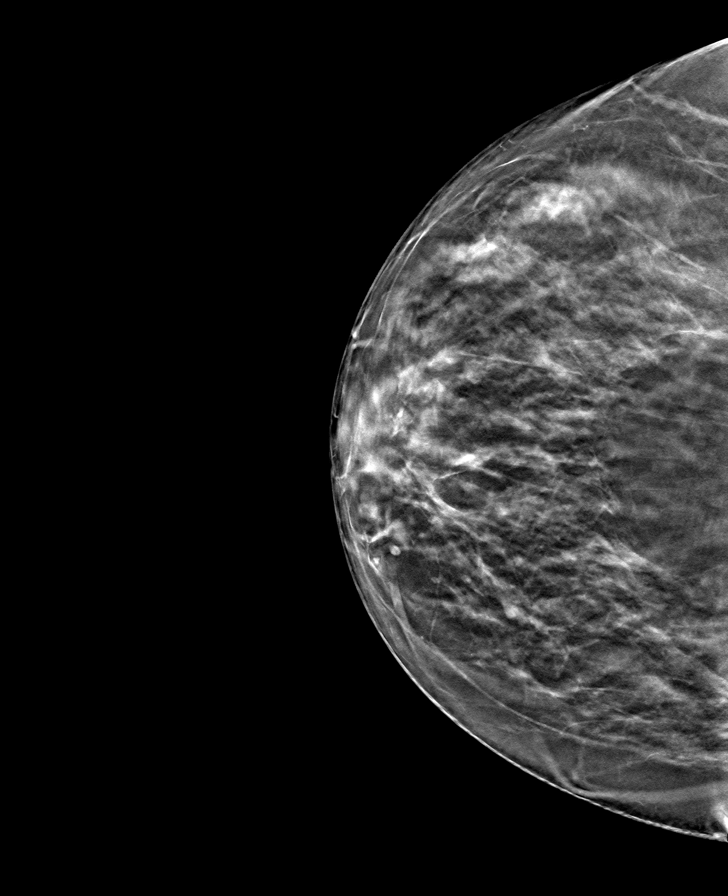

[8 of 24 positions shown; findings below may reference images not displayed]

ACR Breast Density Category c: The breast tissue is heterogeneously
dense, which may obscure small masses.
FINDINGS: There are no findings suspicious for malignancy. Images were
processed with CAD.
IMPRESSION: No mammographic evidence of malignancy. A result letter of this
screening mammogram will be mailed directly to the patient.

RECOMMENDATION:
Screening mammogram in one year. (Code:FT-U-LHB)

BI-RADS CATEGORY  1: Negative.

## 2020-04-10 ENCOUNTER — Other Ambulatory Visit: Payer: Self-pay

## 2020-04-10 ENCOUNTER — Ambulatory Visit (INDEPENDENT_AMBULATORY_CARE_PROVIDER_SITE_OTHER): Payer: Medicare HMO | Admitting: Podiatry

## 2020-04-10 ENCOUNTER — Encounter: Payer: Self-pay | Admitting: Podiatry

## 2020-04-10 DIAGNOSIS — Q828 Other specified congenital malformations of skin: Secondary | ICD-10-CM

## 2020-04-10 NOTE — Progress Notes (Signed)
This patient present to the office  with chief complaint of callus developing under the outside of midfoot.  She says this callus has become painful walking and wearing her shoes.  She presents to the office for treatment of her painful callus.  Vascular  Dorsalis pedis and posterior tibial pulses are palpable  B/L.  Capillary return  WNL.  Temperature gradient is  WNL.  Skin turgor  WNL  Sensorium  Senn Weinstein monofilament wire  WNL. Normal tactile sensation.  Nail Exam  Patient has normal nails with no evidence of bacterial or fungal infection.  Orthopedic  Exam  Muscle tone and muscle strength  WNL.  No limitations of motion feet  B/L.  No crepitus or joint effusion noted.  Foot type is unremarkable and digits show no abnormalities.  Bony prominences are unremarkable.    Skin  No open lesions.  Normal skin texture and turgor.  Callus/porokeratosis  sub 5th metabase right foot.  Porokeratosis  Right foot.  Debride callus/porokeratosis.  Discussed condition with patient.  Gave gel dispersion sheet to this patient.Marland Kitchen  RTC 10 weeks.Helane Gunther DPM

## 2020-06-27 DIAGNOSIS — M130 Polyarthritis, unspecified: Secondary | ICD-10-CM | POA: Diagnosis not present

## 2020-06-27 DIAGNOSIS — E6609 Other obesity due to excess calories: Secondary | ICD-10-CM | POA: Diagnosis not present

## 2020-06-27 DIAGNOSIS — E785 Hyperlipidemia, unspecified: Secondary | ICD-10-CM | POA: Diagnosis not present

## 2020-06-27 DIAGNOSIS — F1020 Alcohol dependence, uncomplicated: Secondary | ICD-10-CM | POA: Diagnosis not present

## 2020-06-27 DIAGNOSIS — R6889 Other general symptoms and signs: Secondary | ICD-10-CM | POA: Diagnosis not present

## 2020-06-28 ENCOUNTER — Other Ambulatory Visit: Payer: Self-pay

## 2020-06-28 ENCOUNTER — Encounter: Payer: Self-pay | Admitting: Podiatry

## 2020-06-28 ENCOUNTER — Ambulatory Visit (INDEPENDENT_AMBULATORY_CARE_PROVIDER_SITE_OTHER): Payer: Medicare HMO | Admitting: Podiatry

## 2020-06-28 DIAGNOSIS — R6889 Other general symptoms and signs: Secondary | ICD-10-CM | POA: Diagnosis not present

## 2020-06-28 DIAGNOSIS — Q828 Other specified congenital malformations of skin: Secondary | ICD-10-CM | POA: Diagnosis not present

## 2020-06-28 NOTE — Progress Notes (Signed)
This patient present to the office  with chief complaint of callus developing under the outside of midfoot.  She says this callus has become painful walking and wearing her shoes.  She presents to the office for treatment of her painful callus.  Vascular  Dorsalis pedis and posterior tibial pulses are palpable  B/L.  Capillary return  WNL.  Temperature gradient is  WNL.  Skin turgor  WNL  Sensorium  Senn Weinstein monofilament wire  WNL. Normal tactile sensation.  Nail Exam  Patient has normal nails with no evidence of bacterial or fungal infection.  Orthopedic  Exam  Muscle tone and muscle strength  WNL.  No limitations of motion feet  B/L.  No crepitus or joint effusion noted.  Foot type is unremarkable and digits show no abnormalities.  Bony prominences are unremarkable.    Skin  No open lesions.  Normal skin texture and turgor.  Callus/porokeratosis  sub 5th metabase right foot.  Porokeratosis  Right foot.  Debride callus/porokeratosis.  Discussed condition with patient.  Gave gel dispersion sheet to this patient.Marland Kitchen  RTC 10 weeks.Helane Gunther DPM

## 2020-08-23 ENCOUNTER — Ambulatory Visit: Payer: Medicare HMO | Attending: Family

## 2020-08-23 DIAGNOSIS — Z23 Encounter for immunization: Secondary | ICD-10-CM

## 2020-09-11 ENCOUNTER — Ambulatory Visit (INDEPENDENT_AMBULATORY_CARE_PROVIDER_SITE_OTHER): Payer: Medicare HMO | Admitting: Podiatry

## 2020-09-11 ENCOUNTER — Encounter: Payer: Self-pay | Admitting: Podiatry

## 2020-09-11 ENCOUNTER — Other Ambulatory Visit: Payer: Self-pay

## 2020-09-11 DIAGNOSIS — Z1211 Encounter for screening for malignant neoplasm of colon: Secondary | ICD-10-CM | POA: Insufficient documentation

## 2020-09-11 DIAGNOSIS — M779 Enthesopathy, unspecified: Secondary | ICD-10-CM | POA: Diagnosis not present

## 2020-09-11 DIAGNOSIS — Q828 Other specified congenital malformations of skin: Secondary | ICD-10-CM | POA: Diagnosis not present

## 2020-09-11 DIAGNOSIS — R635 Abnormal weight gain: Secondary | ICD-10-CM | POA: Insufficient documentation

## 2020-09-11 NOTE — Progress Notes (Signed)
This patient present to the office  with chief complaint of callus developing under the outside of midfoot.  She says this callus has become painful walking and wearing her shoes.  She presents to the office for treatment of her painful callus.  Vascular  Dorsalis pedis and posterior tibial pulses are palpable  B/L.  Capillary return  WNL.  Temperature gradient is  WNL.  Skin turgor  WNL  Sensorium  Senn Weinstein monofilament wire  WNL. Normal tactile sensation.  Nail Exam  Patient has normal nails with no evidence of bacterial or fungal infection.  Orthopedic  Exam  Muscle tone and muscle strength  WNL.  No limitations of motion feet  B/L.  No crepitus or joint effusion noted.  Foot type is unremarkable and digits show no abnormalities.  Bony prominences are unremarkable.    Skin  No open lesions.  Normal skin texture and turgor.  Callus/porokeratosis  sub 5th metabase right foot.  Porokeratosis  Right foot.  Debride callus/porokeratosis.  Discussed condition with patient.  Patient said the gel pad was ineffective.  RTC 10 weeks.Helane Gunther DPM

## 2020-11-04 ENCOUNTER — Other Ambulatory Visit: Payer: Self-pay | Admitting: Family Medicine

## 2020-11-04 DIAGNOSIS — Z1231 Encounter for screening mammogram for malignant neoplasm of breast: Secondary | ICD-10-CM

## 2020-11-14 DIAGNOSIS — R6889 Other general symptoms and signs: Secondary | ICD-10-CM | POA: Diagnosis not present

## 2020-11-14 DIAGNOSIS — I10 Essential (primary) hypertension: Secondary | ICD-10-CM | POA: Diagnosis not present

## 2020-11-29 ENCOUNTER — Encounter: Payer: Self-pay | Admitting: Podiatry

## 2020-11-29 ENCOUNTER — Ambulatory Visit (INDEPENDENT_AMBULATORY_CARE_PROVIDER_SITE_OTHER): Payer: Medicare HMO | Admitting: Podiatry

## 2020-11-29 ENCOUNTER — Other Ambulatory Visit: Payer: Self-pay

## 2020-11-29 DIAGNOSIS — Q828 Other specified congenital malformations of skin: Secondary | ICD-10-CM | POA: Diagnosis not present

## 2020-11-29 DIAGNOSIS — M779 Enthesopathy, unspecified: Secondary | ICD-10-CM | POA: Diagnosis not present

## 2020-11-29 DIAGNOSIS — R6889 Other general symptoms and signs: Secondary | ICD-10-CM | POA: Diagnosis not present

## 2020-11-29 NOTE — Progress Notes (Signed)
This patient present to the office  with chief complaint of callus developing under the outside of midfoot.  She says this callus has become painful walking and wearing her shoes.  She presents to the office for treatment of her painful callus. She says she has only two weeks relief after previous treatment.  Vascular  Dorsalis pedis and posterior tibial pulses are palpable  B/L.  Capillary return  WNL.  Temperature gradient is  WNL.  Skin turgor  WNL  Sensorium  Senn Weinstein monofilament wire  WNL. Normal tactile sensation.  Nail Exam  Patient has normal nails with no evidence of bacterial or fungal infection.  Orthopedic  Exam  Muscle tone and muscle strength  WNL.  No limitations of motion feet  B/L.  No crepitus or joint effusion noted.  Foot type is unremarkable and digits show no abnormalities.  Bony prominences are unremarkable.    Skin  No open lesions.  Normal skin texture and turgor.  Callus/porokeratosis  sub 5th metabase right foot.  Porokeratosis  Right foot.  Debride callus/porokeratosis.  Discussed condition with patient.  Told her to make an appointment with Dr.  Allena Katz for surgical consult for removal of skin lesion.  RTC 10 weeks.Helane Gunther DPM

## 2020-12-06 ENCOUNTER — Other Ambulatory Visit: Payer: Self-pay

## 2020-12-06 ENCOUNTER — Ambulatory Visit (INDEPENDENT_AMBULATORY_CARE_PROVIDER_SITE_OTHER): Payer: Medicare HMO

## 2020-12-06 ENCOUNTER — Ambulatory Visit (INDEPENDENT_AMBULATORY_CARE_PROVIDER_SITE_OTHER): Payer: Medicare HMO | Admitting: Podiatry

## 2020-12-06 DIAGNOSIS — M7751 Other enthesopathy of right foot: Secondary | ICD-10-CM

## 2020-12-06 DIAGNOSIS — R6889 Other general symptoms and signs: Secondary | ICD-10-CM | POA: Diagnosis not present

## 2020-12-06 DIAGNOSIS — M779 Enthesopathy, unspecified: Secondary | ICD-10-CM

## 2020-12-06 DIAGNOSIS — Q828 Other specified congenital malformations of skin: Secondary | ICD-10-CM

## 2020-12-10 ENCOUNTER — Encounter: Payer: Self-pay | Admitting: Podiatry

## 2020-12-10 DIAGNOSIS — E6609 Other obesity due to excess calories: Secondary | ICD-10-CM | POA: Diagnosis not present

## 2020-12-10 DIAGNOSIS — J219 Acute bronchiolitis, unspecified: Secondary | ICD-10-CM | POA: Diagnosis not present

## 2020-12-10 DIAGNOSIS — F1020 Alcohol dependence, uncomplicated: Secondary | ICD-10-CM | POA: Diagnosis not present

## 2020-12-10 DIAGNOSIS — I10 Essential (primary) hypertension: Secondary | ICD-10-CM | POA: Diagnosis not present

## 2020-12-10 NOTE — Progress Notes (Signed)
Subjective:  Patient ID: Alyssa Barry, female    DOB: [birth year],  MRN: [id number]  Chief Complaint  Patient presents with  . Foot Pain    Right foot pain on the bottom of her foot when she walks    75 y.o. female presents with the above complaint.  Patient presents with complaint of right fifth metatarsal base porokeratosis.  Patient states it painful to touch..  We can discuss this further patient states it hurts with ambulation.  Patient states that she has not tried any for it came out of nowhere has progressively gotten worse.  She would like to discuss treatment options for this.  She has not seen anyone else prior to seeing me.  She has seen and been seen in our office for routine foot care.   Review of Systems: Negative except as noted in the HPI. Denies N/V/F/Ch.  Past Medical History:  Diagnosis Date  . High cholesterol   . Hypertension     Current Outpatient Medications:  .  allopurinol (ZYLOPRIM) 100 MG tablet, TK 1 T PO QD, Disp: , Rfl:  .  atorvastatin (LIPITOR) 40 MG tablet, Take 40 mg by mouth daily., Disp: , Rfl:  .  metoprolol (LOPRESSOR) 100 MG tablet, Take 100 mg by mouth daily., Disp: , Rfl:  .  MITIGARE 0.6 MG CAPS, , Disp: , Rfl:  .  Potassium Chloride ER 20 MEQ TBCR, , Disp: , Rfl:  .  predniSONE (DELTASONE) 20 MG tablet, , Disp: , Rfl:  .  triamterene-hydrochlorothiazide (MAXZIDE-25) 37.5-25 MG tablet, TK 1 T PO D FOR BP, Disp: , Rfl:   Social History   Tobacco Use  Smoking Status Current Every Day Smoker  . Packs/day: 1.00  . Types: Cigarettes  Smokeless Tobacco Never Used    Allergies  Allergen Reactions  . Aspirin Other (See Comments)    md told her to stop taking asa because she was taking too much   Objective:  There were no vitals filed for this visit. There is no height or weight on file to calculate BMI. Constitutional Well developed. Well nourished.  Vascular Dorsalis pedis pulses palpable bilaterally. Posterior tibial  pulses palpable bilaterally. Capillary refill normal to all digits.  No cyanosis or clubbing noted. Pedal hair growth normal.  Neurologic Normal speech. Oriented to person, place, and time. Epicritic sensation to light touch grossly present bilaterally.  Dermatologic  hyperkeratotic lesion with central nucleated core noted to right fifth metatarsal base.  Pain on palpation to the lesion.  Appears to be benign in nature.  No pinpoint bleeding noted.  Orthopedic:  Right fifth tarsometatarsal joint pain noted with range of motion of the joint.  Pain on palpation to the joint.  No pain along the course of the peroneal tendon at the insertion. No visible deformities. No bony tenderness.   Radiographs: 3 views of skeletally mature the right foot: No bony osseous abnormality noted.  No fractures noted.  No other bony abnormalities identified. Assessment:   1. Capsulitis of toe of right foot   2. Porokeratosis    Plan:  Patient was evaluated and treated and all questions answered.  Right fifth metatarsal base capsulitis with underlying porokeratosis -I explained to patient the etiology of capsulitis and various treatment options were discussed.  I discussed with the patient the importance of debriding the lesion down to healthy striated tissue to give her some pain relief.  Patient will also benefit from a steroid injection to help decrease acute inflammatory component  associate with pain.  Patient agrees with plan like to proceed with a steroid injection. -A steroid injection was performed at Right fifth metatarsal base using 1% plain Lidocaine and 10 mg of Kenalog. This was well tolerated. -I discussed the etiology of porokeratosis and worse treatment options were discussed.  If there is no reduction in pain or recurrence of the lesion we will discuss surgical excision or chemical destruction of the lesion in the future.  Patient states understanding   No follow-ups on file.

## 2020-12-26 ENCOUNTER — Inpatient Hospital Stay: Admission: RE | Admit: 2020-12-26 | Payer: Medicare HMO | Source: Ambulatory Visit

## 2020-12-27 NOTE — Progress Notes (Signed)
   Covid-19 Vaccination Clinic  Name:  Alyssa Barry    MRN: [id number] DOB: Jan 14, 1946  12/27/2020  [name] was observed post Covid-19 immunization for 15 minutes without incident. She was provided with Vaccine Information Sheet and instruction to access the V-Safe system.   [name] was instructed to call 911 with any severe reactions post vaccine: Marland Kitchen Difficulty breathing  . Swelling of face and throat  . A fast heartbeat  . A bad rash all over body  . Dizziness and weakness   Immunizations Administered    Name Date Dose VIS Date Route   Moderna Covid-19 Booster Vaccine 08/23/2020 12:30 PM 0.25 mL 06/26/2020 Intramuscular   Manufacturer: Moderna   Lot: [id number]   NDC: 16384-665-99

## 2021-02-04 ENCOUNTER — Telehealth: Payer: Self-pay | Admitting: *Deleted

## 2021-02-04 NOTE — Telephone Encounter (Signed)
Patient is calling for her appointment date and time.  Returned call back to give information but no answer and could not leave a vmessage.

## 2021-02-07 ENCOUNTER — Encounter: Payer: Self-pay | Admitting: Podiatry

## 2021-02-07 ENCOUNTER — Other Ambulatory Visit: Payer: Self-pay

## 2021-02-07 ENCOUNTER — Ambulatory Visit (INDEPENDENT_AMBULATORY_CARE_PROVIDER_SITE_OTHER): Payer: Medicare HMO | Admitting: Podiatry

## 2021-02-07 DIAGNOSIS — Q828 Other specified congenital malformations of skin: Secondary | ICD-10-CM

## 2021-02-07 NOTE — Progress Notes (Signed)
This patient present to the office  with chief complaint of callus developing under the outside of midfoot.  She says this callus has become painful walking and wearing her shoes.  She presents to the office for treatment of her painful callus. She says she has only two weeks relief after previous treatment.  Vascular  Dorsalis pedis and posterior tibial pulses are palpable  B/L.  Capillary return  WNL.  Temperature gradient is  WNL.  Skin turgor  WNL  Sensorium  Senn Weinstein monofilament wire  WNL. Normal tactile sensation.  Nail Exam  Patient has normal nails with no evidence of bacterial or fungal infection.  Orthopedic  Exam  Muscle tone and muscle strength  WNL.  No limitations of motion feet  B/L.  No crepitus or joint effusion noted.  Foot type is unremarkable and digits show no abnormalities.  Bony prominences are unremarkable.    Skin  No open lesions.  Normal skin texture and turgor.  Callus/porokeratosis  sub 5th metabase right foot.  Porokeratosis  Right foot.  Debride callus/porokeratosis.  Discussed condition with patient.  RTC 10 weeks.Helane Gunther DPM

## 2021-02-10 DIAGNOSIS — M130 Polyarthritis, unspecified: Secondary | ICD-10-CM | POA: Diagnosis not present

## 2021-02-10 DIAGNOSIS — F1020 Alcohol dependence, uncomplicated: Secondary | ICD-10-CM | POA: Diagnosis not present

## 2021-02-10 DIAGNOSIS — M1000 Idiopathic gout, unspecified site: Secondary | ICD-10-CM | POA: Diagnosis not present

## 2021-02-10 DIAGNOSIS — I10 Essential (primary) hypertension: Secondary | ICD-10-CM | POA: Diagnosis not present

## 2021-02-10 DIAGNOSIS — R6889 Other general symptoms and signs: Secondary | ICD-10-CM | POA: Diagnosis not present

## 2021-02-10 DIAGNOSIS — E669 Obesity, unspecified: Secondary | ICD-10-CM | POA: Diagnosis not present

## 2021-02-10 DIAGNOSIS — E785 Hyperlipidemia, unspecified: Secondary | ICD-10-CM | POA: Diagnosis not present

## 2021-02-10 DIAGNOSIS — Z720 Tobacco use: Secondary | ICD-10-CM | POA: Diagnosis not present

## 2021-02-12 ENCOUNTER — Other Ambulatory Visit: Payer: Self-pay

## 2021-02-12 ENCOUNTER — Ambulatory Visit
Admission: RE | Admit: 2021-02-12 | Discharge: 2021-02-12 | Disposition: A | Discharge status: Home / Self Care | Payer: Medicare HMO | Source: Ambulatory Visit | Attending: Family Medicine | Admitting: Family Medicine

## 2021-02-12 DIAGNOSIS — Z1231 Encounter for screening mammogram for malignant neoplasm of breast: Secondary | ICD-10-CM | POA: Diagnosis not present

## 2021-04-11 DIAGNOSIS — R6889 Other general symptoms and signs: Secondary | ICD-10-CM | POA: Diagnosis not present

## 2021-04-11 DIAGNOSIS — M130 Polyarthritis, unspecified: Secondary | ICD-10-CM | POA: Diagnosis not present

## 2021-04-11 DIAGNOSIS — I739 Peripheral vascular disease, unspecified: Secondary | ICD-10-CM | POA: Diagnosis not present

## 2021-04-11 DIAGNOSIS — E782 Mixed hyperlipidemia: Secondary | ICD-10-CM | POA: Diagnosis not present

## 2021-04-11 DIAGNOSIS — E6609 Other obesity due to excess calories: Secondary | ICD-10-CM | POA: Diagnosis not present

## 2021-04-18 ENCOUNTER — Other Ambulatory Visit: Payer: Self-pay

## 2021-04-18 ENCOUNTER — Encounter: Payer: Self-pay | Admitting: Podiatry

## 2021-04-18 ENCOUNTER — Ambulatory Visit (INDEPENDENT_AMBULATORY_CARE_PROVIDER_SITE_OTHER): Payer: Medicare HMO | Admitting: Podiatry

## 2021-04-18 DIAGNOSIS — R6889 Other general symptoms and signs: Secondary | ICD-10-CM | POA: Diagnosis not present

## 2021-04-18 DIAGNOSIS — Q828 Other specified congenital malformations of skin: Secondary | ICD-10-CM

## 2021-04-18 NOTE — Progress Notes (Signed)
This patient present to the office  with chief complaint of callus developing under the outside of midfoot.  She says this callus has become painful walking and wearing her shoes.  She presents to the office for treatment of her painful callus. She says she has only two weeks relief after previous treatment.  Vascular  Dorsalis pedis and posterior tibial pulses are palpable  B/L.  Capillary return  WNL.  Temperature gradient is  WNL.  Skin turgor  WNL  Sensorium  Senn Weinstein monofilament wire  WNL. Normal tactile sensation.  Nail Exam  Patient has normal nails with no evidence of bacterial or fungal infection.  Orthopedic  Exam  Muscle tone and muscle strength  WNL.  No limitations of motion feet  B/L.  No crepitus or joint effusion noted.  Foot type is unremarkable and digits show no abnormalities.  Bony prominences are unremarkable.    Skin  No open lesions.  Normal skin texture and turgor.  Callus/porokeratosis  sub 5th metabase right foot.  Porokeratosis  Right foot.  Debride callus/porokeratosis.  Discussed condition with patient.  RTC 10 weeks..  Ashley Bultema DPM    

## 2021-05-23 DIAGNOSIS — F17210 Nicotine dependence, cigarettes, uncomplicated: Secondary | ICD-10-CM | POA: Diagnosis not present

## 2021-05-23 DIAGNOSIS — I10 Essential (primary) hypertension: Secondary | ICD-10-CM | POA: Diagnosis not present

## 2021-05-23 DIAGNOSIS — F339 Major depressive disorder, recurrent, unspecified: Secondary | ICD-10-CM | POA: Diagnosis not present

## 2021-05-23 DIAGNOSIS — E6609 Other obesity due to excess calories: Secondary | ICD-10-CM | POA: Diagnosis not present

## 2021-05-23 DIAGNOSIS — Z0000 Encounter for general adult medical examination without abnormal findings: Secondary | ICD-10-CM | POA: Diagnosis not present

## 2021-05-23 DIAGNOSIS — F1020 Alcohol dependence, uncomplicated: Secondary | ICD-10-CM | POA: Diagnosis not present

## 2021-05-23 DIAGNOSIS — E785 Hyperlipidemia, unspecified: Secondary | ICD-10-CM | POA: Diagnosis not present

## 2021-05-23 DIAGNOSIS — R6889 Other general symptoms and signs: Secondary | ICD-10-CM | POA: Diagnosis not present

## 2021-05-23 DIAGNOSIS — Z6838 Body mass index (BMI) 38.0-38.9, adult: Secondary | ICD-10-CM | POA: Diagnosis not present

## 2021-06-04 ENCOUNTER — Other Ambulatory Visit: Payer: Self-pay

## 2021-06-04 DIAGNOSIS — I739 Peripheral vascular disease, unspecified: Secondary | ICD-10-CM

## 2021-06-13 ENCOUNTER — Ambulatory Visit (INDEPENDENT_AMBULATORY_CARE_PROVIDER_SITE_OTHER)
Admission: RE | Admit: 2021-06-13 | Discharge: 2021-06-13 | Disposition: A | Discharge status: Home / Self Care | Payer: Medicare HMO | Source: Ambulatory Visit | Attending: Vascular Surgery | Admitting: Vascular Surgery

## 2021-06-13 ENCOUNTER — Other Ambulatory Visit: Payer: Self-pay

## 2021-06-13 ENCOUNTER — Encounter: Payer: Self-pay | Admitting: Vascular Surgery

## 2021-06-13 ENCOUNTER — Ambulatory Visit (HOSPITAL_COMMUNITY)
Admission: RE | Admit: 2021-06-13 | Discharge: 2021-06-13 | Disposition: A | Discharge status: Home / Self Care | Payer: Medicare HMO | Source: Ambulatory Visit | Attending: Vascular Surgery | Admitting: Vascular Surgery

## 2021-06-13 ENCOUNTER — Ambulatory Visit (INDEPENDENT_AMBULATORY_CARE_PROVIDER_SITE_OTHER): Payer: Medicare HMO | Admitting: Vascular Surgery

## 2021-06-13 VITALS — BP 146/88 | HR 61 | Temp 97.8°F | Resp 20 | Ht 63.0 in | Wt 211.0 lb

## 2021-06-13 DIAGNOSIS — R6889 Other general symptoms and signs: Secondary | ICD-10-CM | POA: Diagnosis not present

## 2021-06-13 DIAGNOSIS — I739 Peripheral vascular disease, unspecified: Secondary | ICD-10-CM | POA: Insufficient documentation

## 2021-06-13 NOTE — Progress Notes (Signed)
Office Note     CC: Right lower extremity pain Requesting Provider:  Renaye Rakers, MD  HPI: Alyssa Barry is a 75 y.o. (1946-05-05) female presenting at the request of .Renaye Rakers, MD with a several month history of right lower extremity pain that has become more noticeable especially when walking up hills.  Pain is described as a burning pain in the right calf that subsides with rest.  This is appreciated more when walking on an incline, however after long walks, the pain can be appreciated on flat ground as well.  This has not become lifestyle limiting.  Alyssa Barry denies rest pain, tissue loss.  She currently lives independently and is a grandmother to over 20 children.  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  The pt is  on a statin for cholesterol management.  The pt is not on a daily aspirin.   Other AC:  - The pt is on medcation for hypertension.   The pt is not diabetic.  Tobacco hx:  current smoker  Past Medical History:  Diagnosis Date   High cholesterol    Hypertension     Past Surgical History:  Procedure Laterality Date   TOE SURGERY     TUBAL LIGATION Bilateral     Social History   Socioeconomic History   Marital status: Single    Spouse name: Not on file   Number of children: Not on file   Years of education: Not on file   Highest education level: Not on file  Occupational History   Not on file  Tobacco Use   Smoking status: Every Day    Packs/day: 0.25    Types: Cigarettes   Smokeless tobacco: Never  Vaping Use   Vaping Use: Never used  Substance and Sexual Activity   Alcohol use: Yes    Comment: occasional   Drug use: No   Sexual activity: Not on file  Other Topics Concern   Not on file  Social History Narrative   Not on file   Social Determinants of Health   Financial Resource Strain: Not on file  Food Insecurity: Not on file  Transportation Needs: Not on  file  Physical Activity: Not on file  Stress: Not on file  Social Connections: Not on file  Intimate Partner Violence: Not on file    Family History  Problem Relation Age of Onset   Hypertension Other     Current Outpatient Medications  Medication Sig Dispense Refill   allopurinol (ZYLOPRIM) 100 MG tablet TK 1 T PO QD     atorvastatin (LIPITOR) 40 MG tablet Take 40 mg by mouth daily.     metoprolol (LOPRESSOR) 100 MG tablet Take 100 mg by mouth daily.     MITIGARE 0.6 MG CAPS      Potassium Chloride ER 20 MEQ TBCR      predniSONE (DELTASONE) 20 MG tablet      triamterene-hydrochlorothiazide (MAXZIDE-25) 37.5-25 MG tablet TK 1 T PO D FOR BP     No current facility-administered medications for this visit.    Allergies  Allergen Reactions   Aspirin Other (See Comments)    md told her to stop taking asa because she was taking too much     REVIEW OF SYSTEMS:   [X]  denotes positive finding, [ ]  denotes negative finding Cardiac  Comments:  Chest pain or chest pressure:    Shortness of breath upon  exertion:    Short of breath when lying flat:    Irregular heart rhythm:        Vascular    Pain in calf, thigh, or hip brought on by ambulation: X   Pain in feet at night that wakes you up from your sleep:     Blood clot in your veins:    Leg swelling:         Pulmonary    Oxygen at home:    Productive cough:     Wheezing:         Neurologic    Sudden weakness in arms or legs:     Sudden numbness in arms or legs:     Sudden onset of difficulty speaking or slurred speech:    Temporary loss of vision in one eye:     Problems with dizziness:         Gastrointestinal    Blood in stool:     Vomited blood:         Genitourinary    Burning when urinating:     Blood in urine:        Psychiatric    Major depression:         Hematologic    Bleeding problems:    Problems with blood clotting too easily:        Skin    Rashes or ulcers:        Constitutional     Fever or chills:      PHYSICAL EXAMINATION:  Vitals:   06/13/21 1053  BP: (!) 146/88  Pulse: 61  Resp: 20  Temp: 97.8 F (36.6 C)  SpO2: 98%  Weight: 211 lb (95.7 kg)  Height: 5\' 3"  (1.6 m)    General:  WDWN in NAD; vital signs documented above Gait: Not observed HENT: WNL, normocephalic Pulmonary: normal non-labored breathing , without Rales, rhonchi,  wheezing Cardiac: regular HR, Abdomen: soft, NT, no masses Skin: without rashes Vascular Exam/Pulses:  Right Left  Radial 2+ (normal) 2+ (normal)  Ulnar 2+ (normal) 2+ (normal)          DP Multiphasic multiphasic  PT multiphasic multiphasic   Extremities: without ischemic changes, without Gangrene , without cellulitis; without open wounds;  Musculoskeletal: no muscle wasting or atrophy  Neurologic: A&O X 3;  No focal weakness or paresthesias are detected Psychiatric:  The pt has Normal affect.   Non-Invasive Vascular Imaging:   Noninvasive vascular ultrasound independently reviewed ABIs demonstrate moderate to vascular disease in the bilateral lower extremities Bilateral lower extremity arterial duplex demonstrates total occlusion of the right popliteal artery, peroneal artery.  On the left, occluded peroneal artery, significant tibial disease.    ASSESSMENT/PLAN:: 75 y.o. female presenting with nonlifestyle limiting claudication, right greater than left. I educated Alyssa Barry on her new diagnosis of claudication as well as what can be done medically to manage prior to needing any intervention.  Alyssa Barry said that she plans to join a gym and workout at least 3 times weekly.  She has noticed weight gain, and wants to exercise more to be around for her grandchildren.  Furthermore, Alyssa Barry is going to work on smoking cessation.  She is not interested in any medications at this time.  I will see Alyssa Barry again in 6 months.  She was asked to call my office should she have worsening symptoms, rest pain, new onset ulcerations on  either extremity. Alyssa Barry would benefit from daily baby aspirin in addition to her statin medication.  She we asked to call our office if any questions or concerns arose.   Victorino Sparrow, MD [facility] [phone]

## 2021-06-17 DIAGNOSIS — I10 Essential (primary) hypertension: Secondary | ICD-10-CM | POA: Diagnosis not present

## 2021-06-17 DIAGNOSIS — E669 Obesity, unspecified: Secondary | ICD-10-CM | POA: Diagnosis not present

## 2021-06-17 DIAGNOSIS — R7309 Other abnormal glucose: Secondary | ICD-10-CM | POA: Diagnosis not present

## 2021-06-17 DIAGNOSIS — F1020 Alcohol dependence, uncomplicated: Secondary | ICD-10-CM | POA: Diagnosis not present

## 2021-06-17 DIAGNOSIS — E6609 Other obesity due to excess calories: Secondary | ICD-10-CM | POA: Diagnosis not present

## 2021-06-17 DIAGNOSIS — Z23 Encounter for immunization: Secondary | ICD-10-CM | POA: Diagnosis not present

## 2021-06-17 DIAGNOSIS — E785 Hyperlipidemia, unspecified: Secondary | ICD-10-CM | POA: Diagnosis not present

## 2021-06-17 DIAGNOSIS — Z719 Counseling, unspecified: Secondary | ICD-10-CM | POA: Diagnosis not present

## 2021-06-17 DIAGNOSIS — Z6838 Body mass index (BMI) 38.0-38.9, adult: Secondary | ICD-10-CM | POA: Diagnosis not present

## 2021-06-17 DIAGNOSIS — F17210 Nicotine dependence, cigarettes, uncomplicated: Secondary | ICD-10-CM | POA: Diagnosis not present

## 2021-06-17 DIAGNOSIS — R6889 Other general symptoms and signs: Secondary | ICD-10-CM | POA: Diagnosis not present

## 2021-07-02 ENCOUNTER — Other Ambulatory Visit: Payer: Self-pay

## 2021-07-02 ENCOUNTER — Encounter: Payer: Self-pay | Admitting: Podiatry

## 2021-07-02 ENCOUNTER — Ambulatory Visit (INDEPENDENT_AMBULATORY_CARE_PROVIDER_SITE_OTHER): Payer: Medicare HMO | Admitting: Podiatry

## 2021-07-02 DIAGNOSIS — Q828 Other specified congenital malformations of skin: Secondary | ICD-10-CM

## 2021-07-02 DIAGNOSIS — R6889 Other general symptoms and signs: Secondary | ICD-10-CM | POA: Diagnosis not present

## 2021-07-02 NOTE — Progress Notes (Signed)
This patient present to the office  with chief complaint of callus developing under the outside of midfoot.  She says this callus has become painful walking and wearing her shoes.  She presents to the office for treatment of her painful callus.    Vascular  Dorsalis pedis and posterior tibial pulses are palpable  B/L.  Capillary return  WNL.  Temperature gradient is  WNL.  Skin turgor  WNL  Sensorium  Senn Weinstein monofilament wire  WNL. Normal tactile sensation.  Nail Exam  Patient has normal nails with no evidence of bacterial or fungal infection.  Orthopedic  Exam  Muscle tone and muscle strength  WNL.  No limitations of motion feet  B/L.  No crepitus or joint effusion noted.  Foot type is unremarkable and digits show no abnormalities.  Bony prominences are unremarkable.    Skin  No open lesions.  Normal skin texture and turgor.  Callus/porokeratosis  sub 5th metabase right foot.  Porokeratosis  Right foot.  Debride callus/porokeratosis.  Discussed condition with patient.  RTC 10 weeks..  Donnamarie Shankles DPM    

## 2021-09-11 DIAGNOSIS — H04123 Dry eye syndrome of bilateral lacrimal glands: Secondary | ICD-10-CM | POA: Diagnosis not present

## 2021-09-11 DIAGNOSIS — Z961 Presence of intraocular lens: Secondary | ICD-10-CM | POA: Diagnosis not present

## 2021-09-11 DIAGNOSIS — H0102B Squamous blepharitis left eye, upper and lower eyelids: Secondary | ICD-10-CM | POA: Diagnosis not present

## 2021-09-11 DIAGNOSIS — H25811 Combined forms of age-related cataract, right eye: Secondary | ICD-10-CM | POA: Diagnosis not present

## 2021-09-11 DIAGNOSIS — H0102A Squamous blepharitis right eye, upper and lower eyelids: Secondary | ICD-10-CM | POA: Diagnosis not present

## 2021-09-11 DIAGNOSIS — R6889 Other general symptoms and signs: Secondary | ICD-10-CM | POA: Diagnosis not present

## 2021-09-18 DIAGNOSIS — R6889 Other general symptoms and signs: Secondary | ICD-10-CM | POA: Diagnosis not present

## 2021-09-18 DIAGNOSIS — E6609 Other obesity due to excess calories: Secondary | ICD-10-CM | POA: Diagnosis not present

## 2021-09-18 DIAGNOSIS — Z720 Tobacco use: Secondary | ICD-10-CM | POA: Diagnosis not present

## 2021-09-18 DIAGNOSIS — F1020 Alcohol dependence, uncomplicated: Secondary | ICD-10-CM | POA: Diagnosis not present

## 2021-09-18 DIAGNOSIS — M130 Polyarthritis, unspecified: Secondary | ICD-10-CM | POA: Diagnosis not present

## 2021-09-18 DIAGNOSIS — I10 Essential (primary) hypertension: Secondary | ICD-10-CM | POA: Diagnosis not present

## 2021-09-26 DIAGNOSIS — H25811 Combined forms of age-related cataract, right eye: Secondary | ICD-10-CM | POA: Diagnosis not present

## 2021-10-06 DIAGNOSIS — R6889 Other general symptoms and signs: Secondary | ICD-10-CM | POA: Diagnosis not present

## 2021-10-08 ENCOUNTER — Other Ambulatory Visit: Payer: Self-pay

## 2021-10-08 ENCOUNTER — Ambulatory Visit (INDEPENDENT_AMBULATORY_CARE_PROVIDER_SITE_OTHER): Payer: Medicare HMO | Admitting: Podiatry

## 2021-10-08 ENCOUNTER — Encounter: Payer: Self-pay | Admitting: Podiatry

## 2021-10-08 DIAGNOSIS — Q828 Other specified congenital malformations of skin: Secondary | ICD-10-CM

## 2021-10-08 DIAGNOSIS — R6889 Other general symptoms and signs: Secondary | ICD-10-CM | POA: Diagnosis not present

## 2021-10-08 NOTE — Progress Notes (Signed)
This patient present to the office  with chief complaint of callus developing under the outside of midfoot.  She says this callus has become painful walking and wearing her shoes.  She presents to the office for treatment of her painful callus.    Vascular  Dorsalis pedis and posterior tibial pulses are palpable  B/L.  Capillary return  WNL.  Temperature gradient is  WNL.  Skin turgor  WNL  Sensorium  Senn Weinstein monofilament wire  WNL. Normal tactile sensation.  Nail Exam  Patient has normal nails with no evidence of bacterial or fungal infection.  Orthopedic  Exam  Muscle tone and muscle strength  WNL.  No limitations of motion feet  B/L.  No crepitus or joint effusion noted.  Foot type is unremarkable and digits show no abnormalities.  Bony prominences are unremarkable.    Skin  No open lesions.  Normal skin texture and turgor.  Callus/porokeratosis  sub 5th metabase right foot.  Porokeratosis  Right foot.  Debride callus/porokeratosis.  Discussed condition with patient.  RTC 10 weeks..  Alyssa Barry DPM    

## 2021-10-27 DIAGNOSIS — R6889 Other general symptoms and signs: Secondary | ICD-10-CM | POA: Diagnosis not present

## 2021-10-27 DIAGNOSIS — H5213 Myopia, bilateral: Secondary | ICD-10-CM | POA: Diagnosis not present

## 2021-11-10 DIAGNOSIS — Z0100 Encounter for examination of eyes and vision without abnormal findings: Secondary | ICD-10-CM | POA: Diagnosis not present

## 2021-11-10 DIAGNOSIS — H524 Presbyopia: Secondary | ICD-10-CM | POA: Diagnosis not present

## 2022-01-06 DIAGNOSIS — E7800 Pure hypercholesterolemia, unspecified: Secondary | ICD-10-CM | POA: Diagnosis not present

## 2022-01-06 DIAGNOSIS — E6609 Other obesity due to excess calories: Secondary | ICD-10-CM | POA: Diagnosis not present

## 2022-01-06 DIAGNOSIS — R6889 Other general symptoms and signs: Secondary | ICD-10-CM | POA: Diagnosis not present

## 2022-01-06 DIAGNOSIS — I10 Essential (primary) hypertension: Secondary | ICD-10-CM | POA: Diagnosis not present

## 2022-01-06 DIAGNOSIS — E1169 Type 2 diabetes mellitus with other specified complication: Secondary | ICD-10-CM | POA: Diagnosis not present

## 2022-01-06 DIAGNOSIS — E785 Hyperlipidemia, unspecified: Secondary | ICD-10-CM | POA: Diagnosis not present

## 2022-01-07 ENCOUNTER — Encounter: Payer: Self-pay | Admitting: Podiatry

## 2022-01-07 ENCOUNTER — Ambulatory Visit (INDEPENDENT_AMBULATORY_CARE_PROVIDER_SITE_OTHER): Payer: Medicare HMO | Admitting: Podiatry

## 2022-01-07 ENCOUNTER — Other Ambulatory Visit: Payer: Self-pay | Admitting: Family Medicine

## 2022-01-07 DIAGNOSIS — Q828 Other specified congenital malformations of skin: Secondary | ICD-10-CM | POA: Diagnosis not present

## 2022-01-07 DIAGNOSIS — R6889 Other general symptoms and signs: Secondary | ICD-10-CM | POA: Diagnosis not present

## 2022-01-07 DIAGNOSIS — Z1231 Encounter for screening mammogram for malignant neoplasm of breast: Secondary | ICD-10-CM

## 2022-01-07 NOTE — Progress Notes (Signed)
This patient present to the office  with chief complaint of callus developing under the outside of midfoot.  She says this callus has become painful walking and wearing her shoes.  She presents to the office for treatment of her painful callus.    Vascular  Dorsalis pedis and posterior tibial pulses are palpable  B/L.  Capillary return  WNL.  Temperature gradient is  WNL.  Skin turgor  WNL  Sensorium  Senn Weinstein monofilament wire  WNL. Normal tactile sensation.  Nail Exam  Patient has normal nails with no evidence of bacterial or fungal infection.  Orthopedic  Exam  Muscle tone and muscle strength  WNL.  No limitations of motion feet  B/L.  No crepitus or joint effusion noted.  Foot type is unremarkable and digits show no abnormalities.  Bony prominences are unremarkable.    Skin  No open lesions.  Normal skin texture and turgor.  Callus/porokeratosis  sub 5th metabase right foot.  Porokeratosis  Right foot.  Debride callus/porokeratosis.  Discussed condition with patient.  RTC 10 weeks..  Devinn Voshell DPM    

## 2022-02-17 ENCOUNTER — Ambulatory Visit
Admission: RE | Admit: 2022-02-17 | Discharge: 2022-02-17 | Disposition: A | Discharge status: Home / Self Care | Payer: Medicare HMO | Source: Ambulatory Visit | Attending: Family Medicine | Admitting: Family Medicine

## 2022-02-17 DIAGNOSIS — Z1231 Encounter for screening mammogram for malignant neoplasm of breast: Secondary | ICD-10-CM

## 2022-02-17 DIAGNOSIS — R6889 Other general symptoms and signs: Secondary | ICD-10-CM | POA: Diagnosis not present

## 2022-03-20 ENCOUNTER — Ambulatory Visit: Payer: Medicare HMO | Admitting: Podiatry

## 2022-03-25 ENCOUNTER — Encounter: Payer: Self-pay | Admitting: Podiatry

## 2022-03-25 ENCOUNTER — Ambulatory Visit (INDEPENDENT_AMBULATORY_CARE_PROVIDER_SITE_OTHER): Payer: Medicare HMO | Admitting: Podiatry

## 2022-03-25 DIAGNOSIS — Q828 Other specified congenital malformations of skin: Secondary | ICD-10-CM

## 2022-03-25 DIAGNOSIS — M779 Enthesopathy, unspecified: Secondary | ICD-10-CM

## 2022-03-25 DIAGNOSIS — R6889 Other general symptoms and signs: Secondary | ICD-10-CM | POA: Diagnosis not present

## 2022-03-25 NOTE — Progress Notes (Signed)
This patient present to the office  with chief complaint of callus developing under the outside of midfoot.  She says this callus has become painful walking and wearing her shoes.  She presents to the office for treatment of her painful callus.    Vascular  Dorsalis pedis and posterior tibial pulses are palpable  B/L.  Capillary return  WNL.  Temperature gradient is  WNL.  Skin turgor  WNL  Sensorium  Senn Weinstein monofilament wire  WNL. Normal tactile sensation.  Nail Exam  Patient has normal nails with no evidence of bacterial or fungal infection.  Orthopedic  Exam  Muscle tone and muscle strength  WNL.  No limitations of motion feet  B/L.  No crepitus or joint effusion noted.  Foot type is unremarkable and digits show no abnormalities.  Bony prominences are unremarkable.    Skin  No open lesions.  Normal skin texture and turgor.  Callus/porokeratosis  sub 5th metabase right foot.  Porokeratosis  Right foot.  Debride callus/porokeratosis.  Discussed condition with patient.  RTC 10 weeks..  Taffy Delconte DPM    

## 2022-04-23 DIAGNOSIS — M1000 Idiopathic gout, unspecified site: Secondary | ICD-10-CM | POA: Diagnosis not present

## 2022-04-23 DIAGNOSIS — E785 Hyperlipidemia, unspecified: Secondary | ICD-10-CM | POA: Diagnosis not present

## 2022-04-23 DIAGNOSIS — Z6841 Body Mass Index (BMI) 40.0 and over, adult: Secondary | ICD-10-CM | POA: Diagnosis not present

## 2022-04-23 DIAGNOSIS — E6609 Other obesity due to excess calories: Secondary | ICD-10-CM | POA: Diagnosis not present

## 2022-04-23 DIAGNOSIS — Z720 Tobacco use: Secondary | ICD-10-CM | POA: Diagnosis not present

## 2022-04-23 DIAGNOSIS — R7309 Other abnormal glucose: Secondary | ICD-10-CM | POA: Diagnosis not present

## 2022-04-23 DIAGNOSIS — R6889 Other general symptoms and signs: Secondary | ICD-10-CM | POA: Diagnosis not present

## 2022-04-23 DIAGNOSIS — F4381 Prolonged grief disorder: Secondary | ICD-10-CM | POA: Diagnosis not present

## 2022-04-23 DIAGNOSIS — F1020 Alcohol dependence, uncomplicated: Secondary | ICD-10-CM | POA: Diagnosis not present

## 2022-04-23 DIAGNOSIS — I10 Essential (primary) hypertension: Secondary | ICD-10-CM | POA: Diagnosis not present

## 2022-04-23 DIAGNOSIS — M130 Polyarthritis, unspecified: Secondary | ICD-10-CM | POA: Diagnosis not present

## 2022-05-18 DIAGNOSIS — E782 Mixed hyperlipidemia: Secondary | ICD-10-CM | POA: Diagnosis not present

## 2022-05-18 DIAGNOSIS — F4381 Prolonged grief disorder: Secondary | ICD-10-CM | POA: Diagnosis not present

## 2022-05-18 DIAGNOSIS — E6609 Other obesity due to excess calories: Secondary | ICD-10-CM | POA: Diagnosis not present

## 2022-05-18 DIAGNOSIS — I10 Essential (primary) hypertension: Secondary | ICD-10-CM | POA: Diagnosis not present

## 2022-05-18 DIAGNOSIS — R6889 Other general symptoms and signs: Secondary | ICD-10-CM | POA: Diagnosis not present

## 2022-05-18 DIAGNOSIS — F339 Major depressive disorder, recurrent, unspecified: Secondary | ICD-10-CM | POA: Diagnosis not present

## 2022-05-18 DIAGNOSIS — F1020 Alcohol dependence, uncomplicated: Secondary | ICD-10-CM | POA: Diagnosis not present

## 2022-06-05 ENCOUNTER — Ambulatory Visit (INDEPENDENT_AMBULATORY_CARE_PROVIDER_SITE_OTHER): Payer: Medicare HMO | Admitting: Podiatry

## 2022-06-05 ENCOUNTER — Encounter: Payer: Self-pay | Admitting: Podiatry

## 2022-06-05 DIAGNOSIS — Q828 Other specified congenital malformations of skin: Secondary | ICD-10-CM

## 2022-06-05 DIAGNOSIS — R6889 Other general symptoms and signs: Secondary | ICD-10-CM | POA: Diagnosis not present

## 2022-06-05 NOTE — Progress Notes (Signed)
This patient present to the office  with chief complaint of callus developing under the outside of midfoot.  She says this callus has become painful walking and wearing her shoes.  She presents to the office for treatment of her painful callus.    Vascular  Dorsalis pedis and posterior tibial pulses are palpable  B/L.  Capillary return  WNL.  Temperature gradient is  WNL.  Skin turgor  WNL  Sensorium  Senn Weinstein monofilament wire  WNL. Normal tactile sensation.  Nail Exam  Patient has normal nails with no evidence of bacterial or fungal infection.  Orthopedic  Exam  Muscle tone and muscle strength  WNL.  No limitations of motion feet  B/L.  No crepitus or joint effusion noted.  Foot type is unremarkable and digits show no abnormalities.  Bony prominences are unremarkable.    Skin  No open lesions.  Normal skin texture and turgor.  Callus/porokeratosis  sub 5th metabase right foot.  Porokeratosis  Right foot.  Debride callus/porokeratosis.  Discussed condition with patient.  RTC 10 weeks.Gardiner Barefoot DPM

## 2022-06-19 DIAGNOSIS — F1020 Alcohol dependence, uncomplicated: Secondary | ICD-10-CM | POA: Diagnosis not present

## 2022-06-19 DIAGNOSIS — Z0000 Encounter for general adult medical examination without abnormal findings: Secondary | ICD-10-CM | POA: Diagnosis not present

## 2022-06-19 DIAGNOSIS — I10 Essential (primary) hypertension: Secondary | ICD-10-CM | POA: Diagnosis not present

## 2022-06-19 DIAGNOSIS — R6889 Other general symptoms and signs: Secondary | ICD-10-CM | POA: Diagnosis not present

## 2022-06-19 DIAGNOSIS — E6609 Other obesity due to excess calories: Secondary | ICD-10-CM | POA: Diagnosis not present

## 2022-06-19 DIAGNOSIS — F339 Major depressive disorder, recurrent, unspecified: Secondary | ICD-10-CM | POA: Diagnosis not present

## 2022-07-22 DIAGNOSIS — F1010 Alcohol abuse, uncomplicated: Secondary | ICD-10-CM | POA: Diagnosis not present

## 2022-07-23 DIAGNOSIS — F1010 Alcohol abuse, uncomplicated: Secondary | ICD-10-CM | POA: Diagnosis not present

## 2022-07-28 DIAGNOSIS — F1010 Alcohol abuse, uncomplicated: Secondary | ICD-10-CM | POA: Diagnosis not present

## 2022-07-30 DIAGNOSIS — F1010 Alcohol abuse, uncomplicated: Secondary | ICD-10-CM | POA: Diagnosis not present

## 2022-08-04 DIAGNOSIS — F1010 Alcohol abuse, uncomplicated: Secondary | ICD-10-CM | POA: Diagnosis not present

## 2022-08-06 DIAGNOSIS — F1010 Alcohol abuse, uncomplicated: Secondary | ICD-10-CM | POA: Diagnosis not present

## 2022-08-11 DIAGNOSIS — F1010 Alcohol abuse, uncomplicated: Secondary | ICD-10-CM | POA: Diagnosis not present

## 2022-08-13 DIAGNOSIS — F1010 Alcohol abuse, uncomplicated: Secondary | ICD-10-CM | POA: Diagnosis not present

## 2022-08-18 DIAGNOSIS — F1010 Alcohol abuse, uncomplicated: Secondary | ICD-10-CM | POA: Diagnosis not present

## 2022-08-20 DIAGNOSIS — F1010 Alcohol abuse, uncomplicated: Secondary | ICD-10-CM | POA: Diagnosis not present

## 2022-08-25 DIAGNOSIS — F1010 Alcohol abuse, uncomplicated: Secondary | ICD-10-CM | POA: Diagnosis not present

## 2022-08-27 DIAGNOSIS — F1010 Alcohol abuse, uncomplicated: Secondary | ICD-10-CM | POA: Diagnosis not present

## 2022-08-28 DIAGNOSIS — E6609 Other obesity due to excess calories: Secondary | ICD-10-CM | POA: Diagnosis not present

## 2022-08-28 DIAGNOSIS — F1020 Alcohol dependence, uncomplicated: Secondary | ICD-10-CM | POA: Diagnosis not present

## 2022-08-28 DIAGNOSIS — R6889 Other general symptoms and signs: Secondary | ICD-10-CM | POA: Diagnosis not present

## 2022-08-28 DIAGNOSIS — I10 Essential (primary) hypertension: Secondary | ICD-10-CM | POA: Diagnosis not present

## 2022-08-28 DIAGNOSIS — M130 Polyarthritis, unspecified: Secondary | ICD-10-CM | POA: Diagnosis not present

## 2022-08-28 DIAGNOSIS — Z720 Tobacco use: Secondary | ICD-10-CM | POA: Diagnosis not present

## 2022-09-09 ENCOUNTER — Ambulatory Visit (INDEPENDENT_AMBULATORY_CARE_PROVIDER_SITE_OTHER): Payer: Medicare HMO | Admitting: Podiatry

## 2022-09-09 ENCOUNTER — Encounter: Payer: Self-pay | Admitting: Podiatry

## 2022-09-09 DIAGNOSIS — Q828 Other specified congenital malformations of skin: Secondary | ICD-10-CM

## 2022-09-09 DIAGNOSIS — L84 Corns and callosities: Secondary | ICD-10-CM

## 2022-09-09 NOTE — Progress Notes (Signed)
This patient present to the office  with chief complaint of callus developing under the outside of midfoot.  She says this callus has become painful walking and wearing her shoes.  She presents to the office for treatment of her painful callus.    Vascular  Dorsalis pedis and posterior tibial pulses are palpable  B/L.  Capillary return  WNL.  Temperature gradient is  WNL.  Skin turgor  WNL  Sensorium  Senn Weinstein monofilament wire  WNL. Normal tactile sensation.  Nail Exam  Patient has normal nails with no evidence of bacterial or fungal infection.  Orthopedic  Exam  Muscle tone and muscle strength  WNL.  No limitations of motion feet  B/L.  No crepitus or joint effusion noted.  Foot type is unremarkable and digits show no abnormalities.  Bony prominences are unremarkable.    Skin  No open lesions.  Normal skin texture and turgor.  Callus/porokeratosis  sub 5th metabase right foot. Listers corn fifth toe left foot.  Porokeratosis  Right foot.  Corn left foot.  Debride callus/porokeratosis.  Discussed condition with patient.  RTC 10 weeks.Gardiner Barefoot DPM

## 2022-09-17 DIAGNOSIS — H0102B Squamous blepharitis left eye, upper and lower eyelids: Secondary | ICD-10-CM | POA: Diagnosis not present

## 2022-09-17 DIAGNOSIS — R6889 Other general symptoms and signs: Secondary | ICD-10-CM | POA: Diagnosis not present

## 2022-09-17 DIAGNOSIS — Z961 Presence of intraocular lens: Secondary | ICD-10-CM | POA: Diagnosis not present

## 2022-09-17 DIAGNOSIS — H04123 Dry eye syndrome of bilateral lacrimal glands: Secondary | ICD-10-CM | POA: Diagnosis not present

## 2022-09-17 DIAGNOSIS — H0102A Squamous blepharitis right eye, upper and lower eyelids: Secondary | ICD-10-CM | POA: Diagnosis not present

## 2022-12-09 ENCOUNTER — Encounter: Payer: Self-pay | Admitting: Podiatry

## 2022-12-09 ENCOUNTER — Ambulatory Visit (INDEPENDENT_AMBULATORY_CARE_PROVIDER_SITE_OTHER): Payer: Medicare HMO | Admitting: Podiatry

## 2022-12-09 DIAGNOSIS — L84 Corns and callosities: Secondary | ICD-10-CM | POA: Diagnosis not present

## 2022-12-09 DIAGNOSIS — Q828 Other specified congenital malformations of skin: Secondary | ICD-10-CM | POA: Diagnosis not present

## 2022-12-09 NOTE — Progress Notes (Signed)
This patient present to the office  with chief complaint of callus developing under the outside of midfoot.  She says this callus has become painful walking and wearing her shoes.  She presents to the office for treatment of her painful callus.    Vascular  Dorsalis pedis and posterior tibial pulses are palpable  B/L.  Capillary return  WNL.  Temperature gradient is  WNL.  Skin turgor  WNL  Sensorium  Senn Weinstein monofilament wire  WNL. Normal tactile sensation.  Nail Exam  Patient has normal nails with no evidence of bacterial or fungal infection.  Orthopedic  Exam  Muscle tone and muscle strength  WNL.  No limitations of motion feet  B/L.  No crepitus or joint effusion noted.  Foot type is unremarkable and digits show no abnormalities.  Bony prominences are unremarkable.    Skin  No open lesions.  Normal skin texture and turgor.  Callus/porokeratosis  sub 5th metabase right foot. Listers corn fifth toe left foot.  Porokeratosis  Right foot.  Corn left foot.  Debride callus/porokeratosis.  Discussed condition with patient.  RTC 10 weeks..  Alyssa Barry DPM    

## 2022-12-29 DIAGNOSIS — F1020 Alcohol dependence, uncomplicated: Secondary | ICD-10-CM | POA: Diagnosis not present

## 2022-12-29 DIAGNOSIS — E6609 Other obesity due to excess calories: Secondary | ICD-10-CM | POA: Diagnosis not present

## 2022-12-29 DIAGNOSIS — I10 Essential (primary) hypertension: Secondary | ICD-10-CM | POA: Diagnosis not present

## 2022-12-29 DIAGNOSIS — E782 Mixed hyperlipidemia: Secondary | ICD-10-CM | POA: Diagnosis not present

## 2023-01-07 ENCOUNTER — Other Ambulatory Visit: Payer: Self-pay | Admitting: Family Medicine

## 2023-01-07 DIAGNOSIS — Z1231 Encounter for screening mammogram for malignant neoplasm of breast: Secondary | ICD-10-CM

## 2023-01-12 DIAGNOSIS — E7800 Pure hypercholesterolemia, unspecified: Secondary | ICD-10-CM | POA: Diagnosis not present

## 2023-01-12 DIAGNOSIS — F1020 Alcohol dependence, uncomplicated: Secondary | ICD-10-CM | POA: Diagnosis not present

## 2023-01-12 DIAGNOSIS — I10 Essential (primary) hypertension: Secondary | ICD-10-CM | POA: Diagnosis not present

## 2023-01-12 DIAGNOSIS — E782 Mixed hyperlipidemia: Secondary | ICD-10-CM | POA: Diagnosis not present

## 2023-01-29 DIAGNOSIS — I10 Essential (primary) hypertension: Secondary | ICD-10-CM | POA: Diagnosis not present

## 2023-01-29 DIAGNOSIS — F4381 Prolonged grief disorder: Secondary | ICD-10-CM | POA: Diagnosis not present

## 2023-01-29 DIAGNOSIS — E6609 Other obesity due to excess calories: Secondary | ICD-10-CM | POA: Diagnosis not present

## 2023-01-29 DIAGNOSIS — E782 Mixed hyperlipidemia: Secondary | ICD-10-CM | POA: Diagnosis not present

## 2023-02-17 ENCOUNTER — Ambulatory Visit (INDEPENDENT_AMBULATORY_CARE_PROVIDER_SITE_OTHER): Payer: Medicare HMO | Admitting: Podiatry

## 2023-02-17 ENCOUNTER — Encounter: Payer: Self-pay | Admitting: Podiatry

## 2023-02-17 DIAGNOSIS — Q828 Other specified congenital malformations of skin: Secondary | ICD-10-CM

## 2023-02-17 DIAGNOSIS — L84 Corns and callosities: Secondary | ICD-10-CM

## 2023-02-17 NOTE — Progress Notes (Signed)
This patient present to the office  with chief complaint of callus developing under the outside of midfoot.  She says this callus has become painful walking and wearing her shoes.  She presents to the office for treatment of her painful callus.    Vascular  Dorsalis pedis and posterior tibial pulses are palpable  B/L.  Capillary return  WNL.  Temperature gradient is  WNL.  Skin turgor  WNL  Sensorium  Senn Weinstein monofilament wire  WNL. Normal tactile sensation.  Nail Exam  Patient has normal nails with no evidence of bacterial or fungal infection.  Orthopedic  Exam  Muscle tone and muscle strength  WNL.  No limitations of motion feet  B/L.  No crepitus or joint effusion noted.  Foot type is unremarkable and digits show no abnormalities.  Bony prominences are unremarkable.    Skin  No open lesions.  Normal skin texture and turgor.  Callus/porokeratosis  sub 5th metabase right foot. Listers corn fifth toe left foot.  Porokeratosis  Right foot.  Corn left foot.  Debride callus/porokeratosis.  Discussed condition with patient.  RTC 10 weeks..  Lori Popowski DPM    

## 2023-02-22 ENCOUNTER — Ambulatory Visit
Admission: RE | Admit: 2023-02-22 | Discharge: 2023-02-22 | Disposition: A | Discharge status: Home / Self Care | Payer: Medicare HMO | Source: Ambulatory Visit | Attending: Family Medicine | Admitting: Family Medicine

## 2023-02-22 ENCOUNTER — Ambulatory Visit: Payer: Medicare HMO

## 2023-02-22 DIAGNOSIS — Z1231 Encounter for screening mammogram for malignant neoplasm of breast: Secondary | ICD-10-CM | POA: Diagnosis not present

## 2023-02-25 DIAGNOSIS — F1020 Alcohol dependence, uncomplicated: Secondary | ICD-10-CM | POA: Diagnosis not present

## 2023-02-25 DIAGNOSIS — I10 Essential (primary) hypertension: Secondary | ICD-10-CM | POA: Diagnosis not present

## 2023-02-25 DIAGNOSIS — F4381 Prolonged grief disorder: Secondary | ICD-10-CM | POA: Diagnosis not present

## 2023-02-25 DIAGNOSIS — E782 Mixed hyperlipidemia: Secondary | ICD-10-CM | POA: Diagnosis not present

## 2023-02-25 DIAGNOSIS — E669 Obesity, unspecified: Secondary | ICD-10-CM | POA: Diagnosis not present

## 2023-03-19 DIAGNOSIS — I10 Essential (primary) hypertension: Secondary | ICD-10-CM | POA: Diagnosis not present

## 2023-03-19 DIAGNOSIS — I1A0 Resistant hypertension: Secondary | ICD-10-CM | POA: Diagnosis not present

## 2023-04-23 DIAGNOSIS — I1A0 Resistant hypertension: Secondary | ICD-10-CM | POA: Diagnosis not present

## 2023-04-23 DIAGNOSIS — E6609 Other obesity due to excess calories: Secondary | ICD-10-CM | POA: Diagnosis not present

## 2023-04-23 DIAGNOSIS — I10 Essential (primary) hypertension: Secondary | ICD-10-CM | POA: Diagnosis not present

## 2023-04-23 DIAGNOSIS — F1020 Alcohol dependence, uncomplicated: Secondary | ICD-10-CM | POA: Diagnosis not present

## 2023-04-28 ENCOUNTER — Ambulatory Visit (INDEPENDENT_AMBULATORY_CARE_PROVIDER_SITE_OTHER): Payer: Medicare HMO | Admitting: Podiatry

## 2023-04-28 DIAGNOSIS — Q828 Other specified congenital malformations of skin: Secondary | ICD-10-CM

## 2023-04-28 NOTE — Progress Notes (Signed)
This patient present to the office  with chief complaint of callus developing under the outside of midfoot.  She says this callus has become painful walking and wearing her shoes.  She presents to the office for treatment of her painful callus.    Vascular  Dorsalis pedis and posterior tibial pulses are palpable  B/L.  Capillary return  WNL.  Temperature gradient is  WNL.  Skin turgor  WNL  Sensorium  Senn Weinstein monofilament wire  WNL. Normal tactile sensation.  Nail Exam  Patient has normal nails with no evidence of bacterial or fungal infection.  Orthopedic  Exam  Muscle tone and muscle strength  WNL.  No limitations of motion feet  B/L.  No crepitus or joint effusion noted.  Foot type is unremarkable and digits show no abnormalities.  Bony prominences are unremarkable.    Skin  No open lesions.  Normal skin texture and turgor.  Callus/porokeratosis  sub 5th metabase right foot.  Porokeratosis  Right foot.  Debride callus/porokeratosis.  Discussed condition with patient.  RTC 10 weeks..  Gregory Mayer DPM    

## 2023-05-24 DIAGNOSIS — F339 Major depressive disorder, recurrent, unspecified: Secondary | ICD-10-CM | POA: Diagnosis not present

## 2023-05-24 DIAGNOSIS — M1000 Idiopathic gout, unspecified site: Secondary | ICD-10-CM | POA: Diagnosis not present

## 2023-05-24 DIAGNOSIS — Z6841 Body Mass Index (BMI) 40.0 and over, adult: Secondary | ICD-10-CM | POA: Diagnosis not present

## 2023-05-24 DIAGNOSIS — E6609 Other obesity due to excess calories: Secondary | ICD-10-CM | POA: Diagnosis not present

## 2023-05-24 DIAGNOSIS — F17210 Nicotine dependence, cigarettes, uncomplicated: Secondary | ICD-10-CM | POA: Diagnosis not present

## 2023-05-24 DIAGNOSIS — I10 Essential (primary) hypertension: Secondary | ICD-10-CM | POA: Diagnosis not present

## 2023-05-24 DIAGNOSIS — E782 Mixed hyperlipidemia: Secondary | ICD-10-CM | POA: Diagnosis not present

## 2023-05-24 DIAGNOSIS — I739 Peripheral vascular disease, unspecified: Secondary | ICD-10-CM | POA: Diagnosis not present

## 2023-05-24 DIAGNOSIS — I1A0 Resistant hypertension: Secondary | ICD-10-CM | POA: Diagnosis not present

## 2023-07-07 ENCOUNTER — Ambulatory Visit: Payer: Medicare HMO | Admitting: Podiatry

## 2023-07-08 DIAGNOSIS — F339 Major depressive disorder, recurrent, unspecified: Secondary | ICD-10-CM | POA: Diagnosis not present

## 2023-07-08 DIAGNOSIS — E785 Hyperlipidemia, unspecified: Secondary | ICD-10-CM | POA: Diagnosis not present

## 2023-07-08 DIAGNOSIS — E6609 Other obesity due to excess calories: Secondary | ICD-10-CM | POA: Diagnosis not present

## 2023-07-08 DIAGNOSIS — Z720 Tobacco use: Secondary | ICD-10-CM | POA: Diagnosis not present

## 2023-07-08 DIAGNOSIS — R635 Abnormal weight gain: Secondary | ICD-10-CM | POA: Diagnosis not present

## 2023-07-08 DIAGNOSIS — I10 Essential (primary) hypertension: Secondary | ICD-10-CM | POA: Diagnosis not present

## 2023-08-09 DIAGNOSIS — E669 Obesity, unspecified: Secondary | ICD-10-CM | POA: Diagnosis not present

## 2023-08-09 DIAGNOSIS — F339 Major depressive disorder, recurrent, unspecified: Secondary | ICD-10-CM | POA: Diagnosis not present

## 2023-08-09 DIAGNOSIS — I1A0 Resistant hypertension: Secondary | ICD-10-CM | POA: Diagnosis not present

## 2023-08-09 DIAGNOSIS — I10 Essential (primary) hypertension: Secondary | ICD-10-CM | POA: Diagnosis not present

## 2023-08-13 ENCOUNTER — Ambulatory Visit (INDEPENDENT_AMBULATORY_CARE_PROVIDER_SITE_OTHER): Payer: Medicare HMO | Admitting: Podiatry

## 2023-08-13 ENCOUNTER — Encounter: Payer: Self-pay | Admitting: Podiatry

## 2023-08-13 DIAGNOSIS — Q828 Other specified congenital malformations of skin: Secondary | ICD-10-CM

## 2023-08-13 NOTE — Progress Notes (Signed)
This patient present to the office  with chief complaint of callus developing under the outside of midfoot.  She says this callus has become painful walking and wearing her shoes.  She presents to the office for treatment of her painful callus.    Vascular  Dorsalis pedis and posterior tibial pulses are palpable  B/L.  Capillary return  WNL.  Temperature gradient is  WNL.  Skin turgor  WNL  Sensorium  Senn Weinstein monofilament wire  WNL. Normal tactile sensation.  Nail Exam  Patient has normal nails with no evidence of bacterial or fungal infection.  Orthopedic  Exam  Muscle tone and muscle strength  WNL.  No limitations of motion feet  B/L.  No crepitus or joint effusion noted.  Foot type is unremarkable and digits show no abnormalities.  Bony prominences are unremarkable.    Skin  No open lesions.  Normal skin texture and turgor.  Callus/porokeratosis  sub 5th metabase right foot.  Porokeratosis  Right foot.  Debride callus/porokeratosis.  Discussed condition with patient.  RTC 10 weeks..  Taffy Delconte DPM    

## 2023-10-25 ENCOUNTER — Ambulatory Visit (INDEPENDENT_AMBULATORY_CARE_PROVIDER_SITE_OTHER): Payer: Medicare HMO | Admitting: Podiatry

## 2023-10-25 ENCOUNTER — Encounter: Payer: Self-pay | Admitting: Podiatry

## 2023-10-25 DIAGNOSIS — L84 Corns and callosities: Secondary | ICD-10-CM

## 2023-10-25 DIAGNOSIS — Q828 Other specified congenital malformations of skin: Secondary | ICD-10-CM

## 2023-10-25 NOTE — Progress Notes (Signed)
This patient present to the office  with chief complaint of callus developing under the outside of midfoot.  She says this callus has become painful walking and wearing her shoes.  She presents to the office for treatment of her painful callus.    Vascular  Dorsalis pedis and posterior tibial pulses are palpable  B/L.  Capillary return  WNL.  Temperature gradient is  WNL.  Skin turgor  WNL  Sensorium  Senn Weinstein monofilament wire  WNL. Normal tactile sensation.  Nail Exam  Patient has normal nails with no evidence of bacterial or fungal infection.  Orthopedic  Exam  Muscle tone and muscle strength  WNL.  No limitations of motion feet  B/L.  No crepitus or joint effusion noted.  Foot type is unremarkable and digits show no abnormalities.  Bony prominences are unremarkable.    Skin  No open lesions.  Normal skin texture and turgor.  Callus/porokeratosis  sub 5th metabase right foot. Listers corn.  Porokeratosis  Right foot.   Listers corn fifth toe left foot.  Debride callus/porokeratosis.  Discussed condition with patient.  RTC 10 weeks.Helane Gunther DPM

## 2023-11-22 DIAGNOSIS — E6609 Other obesity due to excess calories: Secondary | ICD-10-CM | POA: Diagnosis not present

## 2023-11-22 DIAGNOSIS — I10 Essential (primary) hypertension: Secondary | ICD-10-CM | POA: Diagnosis not present

## 2024-01-03 DIAGNOSIS — F4381 Prolonged grief disorder: Secondary | ICD-10-CM | POA: Diagnosis not present

## 2024-01-03 DIAGNOSIS — I10 Essential (primary) hypertension: Secondary | ICD-10-CM | POA: Diagnosis not present

## 2024-01-03 DIAGNOSIS — E6609 Other obesity due to excess calories: Secondary | ICD-10-CM | POA: Diagnosis not present

## 2024-01-05 ENCOUNTER — Ambulatory Visit (INDEPENDENT_AMBULATORY_CARE_PROVIDER_SITE_OTHER): Payer: Medicare HMO | Admitting: Podiatry

## 2024-01-05 ENCOUNTER — Encounter: Payer: Self-pay | Admitting: Podiatry

## 2024-01-05 DIAGNOSIS — Q828 Other specified congenital malformations of skin: Secondary | ICD-10-CM

## 2024-01-05 NOTE — Progress Notes (Signed)
 This patient present to the office  with chief complaint of callus developing under the outside of midfoot.  She says this callus has become painful walking and wearing her shoes.  She presents to the office for treatment of her painful callus.    Vascular  Dorsalis pedis and posterior tibial pulses are palpable  B/L.  Capillary return  WNL.  Temperature gradient is  WNL.  Skin turgor  WNL  Sensorium  Senn Weinstein monofilament wire  WNL. Normal tactile sensation.  Nail Exam  Patient has normal nails with no evidence of bacterial or fungal infection.  Orthopedic  Exam  Muscle tone and muscle strength  WNL.  No limitations of motion feet  B/L.  No crepitus or joint effusion noted.  Foot type is unremarkable and digits show no abnormalities.  Bony prominences are unremarkable.    Skin  No open lesions.  Normal skin texture and turgor.  Callus/porokeratosis  sub 5th metabase right foot. Listers corn.  Porokeratosis  Right foot.   Listers corn fifth toe left foot.  Debride callus/porokeratosis.  Discussed condition with patient.  Told to pick up spenco insole. RTC 10 weeks.Ruffin Cotton DPM

## 2024-01-17 ENCOUNTER — Other Ambulatory Visit: Payer: Self-pay | Admitting: Family Medicine

## 2024-01-17 DIAGNOSIS — Z1231 Encounter for screening mammogram for malignant neoplasm of breast: Secondary | ICD-10-CM

## 2024-01-25 DIAGNOSIS — R2689 Other abnormalities of gait and mobility: Secondary | ICD-10-CM | POA: Diagnosis not present

## 2024-01-25 DIAGNOSIS — I10 Essential (primary) hypertension: Secondary | ICD-10-CM | POA: Diagnosis not present

## 2024-02-10 ENCOUNTER — Ambulatory Visit (HOSPITAL_COMMUNITY)

## 2024-02-19 ENCOUNTER — Ambulatory Visit (HOSPITAL_COMMUNITY): Admission: EM | Admit: 2024-02-19 | Discharge: 2024-02-19 | Disposition: A | Discharge status: Home / Self Care

## 2024-02-19 ENCOUNTER — Encounter (HOSPITAL_COMMUNITY): Payer: Self-pay | Admitting: Emergency Medicine

## 2024-02-19 ENCOUNTER — Other Ambulatory Visit: Payer: Self-pay

## 2024-02-19 DIAGNOSIS — M62838 Other muscle spasm: Secondary | ICD-10-CM | POA: Diagnosis not present

## 2024-02-19 NOTE — ED Triage Notes (Signed)
 Patient has noticed a sharp, shooting pain, intermittently in left axilla, rib cage.  States she has a mammogram scheduled for July.  Pain may occur 2 times a week and is brief

## 2024-02-19 NOTE — ED Provider Notes (Signed)
 [facility]    CSN: [id number] Arrival date & time: 02/19/24  1008      History   Chief Complaint Chief Complaint  Patient presents with   axilla pain    HPI Alyssa Barry is a 78 y.o. female.   Presents with intermittent sharp pain to left ribs that has been occurring over the last 2 to 3 weeks.  She reports pain occurs at rest and only lasts for few seconds.  She denies chest pain, shortness of breath, cough.  She does say that she has been doing more yard work lately.  He has tried nothing for the symptoms.  She has an upcoming PCP appointment in about 2 weeks and a mammogram scheduled for July.  She denies radiation of pain.    Past Medical History:  Diagnosis Date   High cholesterol    Hypertension     Patient Active Problem List   Diagnosis Date Noted   Corn 09/09/2022   Abnormal weight gain 09/11/2020   Colon cancer screening 09/11/2020   Morbid obesity (HCC) 09/11/2020   Avulsion fracture of ankle 10/17/2013   Right rib fracture 10/17/2013    Past Surgical History:  Procedure Laterality Date   TOE SURGERY     TUBAL LIGATION Bilateral     OB History   No obstetric history on file.      Home Medications    Prior to Admission medications   Medication Sig Start Date End Date Taking? Authorizing Provider  allopurinol (ZYLOPRIM) 100 MG tablet TK 1 T PO QD 03/25/19   [provider]  amLODipine (NORVASC) 10 MG tablet Take by mouth. 10/23/21   [provider]  atorvastatin (LIPITOR) 40 MG tablet Take 40 mg by mouth daily.    [provider]  Cholecalciferol (VITAMIN D3) 1.25 MG (50000 UT) CAPS Take 1 capsule by mouth once a week. 09/12/23   [provider]  hydrALAZINE (APRESOLINE) 50 MG tablet Take 50 mg by mouth 2 (two) times daily. 07/08/23   [provider]  metoprolol  (LOPRESSOR ) 100 MG tablet Take 100 mg by mouth daily.    [provider]  MITIGARE 0.6 MG CAPS  09/27/20   [provider]  ofloxacin (OCUFLOX) 0.3 % ophthalmic solution  10/20/21   [provider]  Potassium Chloride  ER 20 MEQ TBCR  02/28/20   [provider]  predniSONE (DELTASONE) 20 MG tablet  05/06/19   [provider]  spironolactone (ALDACTONE) 50 MG tablet Take 50 mg by mouth every morning. 07/26/23   [provider]  triamterene -hydrochlorothiazide (MAXZIDE-25) 37.5-25 MG tablet TK 1 T PO D FOR BP 03/22/19   [provider]    Family History Family History  Problem Relation Age of Onset   Hypertension Other     Social History Social History   Tobacco Use   Smoking status: Every Day    Current packs/day: 0.25    Types: Cigarettes   Smokeless tobacco: Never  Vaping Use   Vaping status: Never Used  Substance Use Topics   Alcohol use: Not Currently    Comment: occasional   Drug use: Yes    Types: Marijuana     Allergies   Aspirin   Review of Systems Review of Systems  Constitutional:  Negative for chills and fever.  HENT:  Negative for ear pain and sore throat.   Eyes:  Negative for pain and visual disturbance.  Respiratory:  Negative for cough and shortness of  breath.   Cardiovascular:  Negative for chest pain and palpitations.  Gastrointestinal:  Negative for abdominal pain and vomiting.  Genitourinary:  Negative for dysuria and hematuria.  Musculoskeletal:  Negative for arthralgias and back pain.  Skin:  Negative for color change and rash.  Neurological:  Negative for seizures and syncope.  All other systems reviewed and are negative.    Physical Exam Triage Vital Signs ED Triage Vitals  Encounter Vitals Group     BP 02/19/24 1105 127/77     Girls Systolic BP Percentile --      Girls Diastolic BP Percentile --      Boys Systolic BP Percentile --      Boys Diastolic BP Percentile --      Pulse Rate 02/19/24 1105 77     Resp 02/19/24 1105 20     Temp 02/19/24 1105 98 F (36.7 C)     Temp Source 02/19/24 1105  Oral     SpO2 02/19/24 1105 97 %     Weight --      Height --      Head Circumference --      Peak Flow --      Pain Score 02/19/24 1101 8     Pain Loc --      Pain Education --      Exclude from Growth Chart --    No data found.  Updated Vital Signs BP 127/77 (BP Location: Right Arm) Comment (BP Location): large cuff  Pulse 77   Temp 98 F (36.7 C) (Oral)   Resp 20   SpO2 97%   Visual Acuity Right Eye Distance:   Left Eye Distance:   Bilateral Distance:    Right Eye Near:   Left Eye Near:    Bilateral Near:     Physical Exam Vitals and nursing note reviewed.  Constitutional:      General: She is not in acute distress.    Appearance: She is well-developed.  HENT:     Head: Normocephalic and atraumatic.   Eyes:     Conjunctiva/sclera: Conjunctivae normal.    Cardiovascular:     Rate and Rhythm: Normal rate and regular rhythm.     Heart sounds: No murmur heard. Pulmonary:     Effort: Pulmonary effort is normal. No respiratory distress.     Breath sounds: Normal breath sounds.  Chest:   Abdominal:     Palpations: Abdomen is soft.     Tenderness: There is no abdominal tenderness.   Musculoskeletal:        General: No swelling.     Cervical back: Neck supple.   Skin:    General: Skin is warm and dry.     Capillary Refill: Capillary refill takes less than 2 seconds.   Neurological:     Mental Status: She is alert.   Psychiatric:        Mood and Affect: Mood normal.      UC Treatments / Results  Labs (all labs ordered are listed, but only abnormal results are displayed) Labs Reviewed - No data to display  EKG   Radiology No results found.  Procedures Procedures (including critical care time)  Medications Ordered in UC Medications - No data to display  Initial Impression / Assessment and Plan / UC Course  I have reviewed the triage vital signs and the nursing notes.  Pertinent labs & imaging results that were available during my care  of the patient were reviewed by me  and considered in my medical decision making (see chart for details).     Low suspicion for ACS.  Pain is reproducible to left lateral ribs.  Of care discussed.  She has a follow-up with her PCP in about 2 weeks advised to keep this appointment.  Return precautions discussed. Final Clinical Impressions(s) / UC Diagnoses   Final diagnoses:  Muscle spasm     Discharge Instructions      Recommend heat or ice to affected area Can take ibuprofen  or Tylenol  as needed for discomfort.   Keep your upcoming follow-up with your primary care and discuss if has not improved.   ED Prescriptions   None    PDMP not reviewed this encounter.   Ward, Char Common, PA-C 02/19/24 1147

## 2024-02-19 NOTE — Discharge Instructions (Addendum)
 Recommend heat or ice to affected area Can take ibuprofen  or Tylenol  as needed for discomfort.   Keep your upcoming follow-up with your primary care and discuss if has not improved.

## 2024-02-23 ENCOUNTER — Ambulatory Visit

## 2024-03-07 DIAGNOSIS — I1A0 Resistant hypertension: Secondary | ICD-10-CM | POA: Diagnosis not present

## 2024-03-07 DIAGNOSIS — M130 Polyarthritis, unspecified: Secondary | ICD-10-CM | POA: Diagnosis not present

## 2024-03-07 DIAGNOSIS — E785 Hyperlipidemia, unspecified: Secondary | ICD-10-CM | POA: Diagnosis not present

## 2024-03-07 DIAGNOSIS — I10 Essential (primary) hypertension: Secondary | ICD-10-CM | POA: Diagnosis not present

## 2024-03-07 DIAGNOSIS — E6609 Other obesity due to excess calories: Secondary | ICD-10-CM | POA: Diagnosis not present

## 2024-03-08 ENCOUNTER — Ambulatory Visit
Admission: RE | Admit: 2024-03-08 | Discharge: 2024-03-08 | Disposition: A | Discharge status: Home / Self Care | Source: Ambulatory Visit | Attending: Family Medicine | Admitting: Family Medicine

## 2024-03-08 DIAGNOSIS — Z1231 Encounter for screening mammogram for malignant neoplasm of breast: Secondary | ICD-10-CM

## 2024-03-22 ENCOUNTER — Ambulatory Visit (INDEPENDENT_AMBULATORY_CARE_PROVIDER_SITE_OTHER): Admitting: Podiatry

## 2024-03-22 ENCOUNTER — Encounter: Payer: Self-pay | Admitting: Podiatry

## 2024-03-22 DIAGNOSIS — Q828 Other specified congenital malformations of skin: Secondary | ICD-10-CM

## 2024-03-22 NOTE — Progress Notes (Signed)
 This patient present to the office  with chief complaint of callus developing under the outside of midfoot.  She says this callus has become painful walking and wearing her shoes.  She presents to the office for treatment of her painful callus.    Vascular  Dorsalis pedis and posterior tibial pulses are palpable  B/L.  Capillary return  WNL.  Temperature gradient is  WNL.  Skin turgor  WNL  Sensorium  Senn Weinstein monofilament wire  WNL. Normal tactile sensation.  Nail Exam  Patient has normal nails with no evidence of bacterial or fungal infection.  Orthopedic  Exam  Muscle tone and muscle strength  WNL.  No limitations of motion feet  B/L.  No crepitus or joint effusion noted.  Foot type is unremarkable and digits show no abnormalities.  Bony prominences are unremarkable.    Skin  No open lesions.  Normal skin texture and turgor.  Callus/porokeratosis  sub 5th metabase right foot. Listers corn.  Porokeratosis  Right foot.   Listers corn fifth toe left foot.  Debride callus/porokeratosis.  Discussed condition with patient.  Told to pick up spenco insole. RTC 10 weeks.Ruffin Cotton DPM

## 2024-06-26 ENCOUNTER — Ambulatory Visit: Admitting: Podiatry

## 2024-06-26 ENCOUNTER — Encounter: Payer: Self-pay | Admitting: Podiatry

## 2024-06-26 DIAGNOSIS — Q828 Other specified congenital malformations of skin: Secondary | ICD-10-CM

## 2024-06-26 NOTE — Progress Notes (Signed)
 This patient present to the office  with chief complaint of callus developing under the outside of midfoot.  She says this callus has become painful walking and wearing her shoes.  She presents to the office for treatment of her painful callus.    Vascular  Dorsalis pedis and posterior tibial pulses are palpable  B/L.  Capillary return  WNL.  Temperature gradient is  WNL.  Skin turgor  WNL  Sensorium  Senn Weinstein monofilament wire  WNL. Normal tactile sensation.  Nail Exam  Patient has normal nails with no evidence of bacterial or fungal infection.  Orthopedic  Exam  Muscle tone and muscle strength  WNL.  No limitations of motion feet  B/L.  No crepitus or joint effusion noted.  Foot type is unremarkable and digits show no abnormalities.  Bony prominences are unremarkable.    Skin  No open lesions.  Normal skin texture and turgor.    Porokeratosis  Right foot.     Debride callus/porokeratosis.  Discussed condition with patient.   RTC 10 weeks.SABRA Cordella Bold DPM

## 2024-07-31 ENCOUNTER — Other Ambulatory Visit: Payer: Self-pay | Admitting: Family Medicine

## 2024-07-31 ENCOUNTER — Ambulatory Visit
Admission: RE | Admit: 2024-07-31 | Discharge: 2024-07-31 | Disposition: A | Source: Ambulatory Visit | Attending: Family Medicine | Admitting: Family Medicine

## 2024-07-31 DIAGNOSIS — M1611 Unilateral primary osteoarthritis, right hip: Secondary | ICD-10-CM | POA: Diagnosis not present

## 2024-07-31 DIAGNOSIS — M25551 Pain in right hip: Secondary | ICD-10-CM

## 2024-07-31 DIAGNOSIS — M130 Polyarthritis, unspecified: Secondary | ICD-10-CM

## 2024-09-04 ENCOUNTER — Ambulatory Visit: Admitting: Podiatry

## 2024-09-13 ENCOUNTER — Encounter: Payer: Self-pay | Admitting: Podiatry

## 2024-09-13 ENCOUNTER — Ambulatory Visit: Admitting: Podiatry

## 2024-09-13 DIAGNOSIS — L84 Corns and callosities: Secondary | ICD-10-CM

## 2024-09-13 DIAGNOSIS — Q828 Other specified congenital malformations of skin: Secondary | ICD-10-CM | POA: Diagnosis not present

## 2024-09-13 NOTE — Progress Notes (Signed)
 This patient present to the office  with chief complaint of callus developing under the outside of midfoot.  She says this callus has become painful walking and wearing her shoes.  She presents to the office for treatment of her painful callus.    Vascular  Dorsalis pedis and posterior tibial pulses are palpable  B/L.  Capillary return  WNL.  Temperature gradient is  WNL.  Skin turgor  WNL  Sensorium  Senn Weinstein monofilament wire  WNL. Normal tactile sensation.  Nail Exam  Patient has normal nails with no evidence of bacterial or fungal infection.  Orthopedic  Exam  Muscle tone and muscle strength  WNL.  No limitations of motion feet  B/L.  No crepitus or joint effusion noted.  Foot type is unremarkable and digits show no abnormalities.  Bony prominences are unremarkable.  Adducto-varus hammer toe fifth left foot.  Skin  No open lesions.  Normal skin texture and turgor.  Listers corn fifth left.  Callus sub 5th metabase right foot.  Porokeratosis  Right foot.   Listers corn left foot.  Debride callus/porokeratosis.  Discussed condition with patient.   RTC 10 weeks.SABRA Cordella Bold DPM

## 2024-11-23 ENCOUNTER — Ambulatory Visit: Admitting: Podiatry
